# Patient Record
Sex: Female | Born: 1945 | Race: White | Hispanic: No | Smoking: Former smoker
Health system: Southern US, Community
[De-identification: ages and names within clinical notes are randomized; demographics above are authoritative.]

## PROBLEM LIST (undated history)

## (undated) DIAGNOSIS — Z860100 Personal history of colon polyps, unspecified: Secondary | ICD-10-CM

## (undated) DIAGNOSIS — R1310 Dysphagia, unspecified: Secondary | ICD-10-CM

## (undated) DIAGNOSIS — H269 Unspecified cataract: Secondary | ICD-10-CM

## (undated) DIAGNOSIS — K5790 Diverticulosis of intestine, part unspecified, without perforation or abscess without bleeding: Secondary | ICD-10-CM

## (undated) DIAGNOSIS — K648 Other hemorrhoids: Secondary | ICD-10-CM

## (undated) DIAGNOSIS — C50919 Malignant neoplasm of unspecified site of unspecified female breast: Secondary | ICD-10-CM

## (undated) DIAGNOSIS — E785 Hyperlipidemia, unspecified: Secondary | ICD-10-CM

## (undated) DIAGNOSIS — M810 Age-related osteoporosis without current pathological fracture: Secondary | ICD-10-CM

## (undated) DIAGNOSIS — K2970 Gastritis, unspecified, without bleeding: Secondary | ICD-10-CM

## (undated) DIAGNOSIS — K219 Gastro-esophageal reflux disease without esophagitis: Secondary | ICD-10-CM

## (undated) HISTORY — DX: Dysphagia, unspecified: R13.10

## (undated) HISTORY — DX: Hyperlipidemia, unspecified: E78.5

## (undated) HISTORY — PX: HEMORRHOID BANDING: SHX5850

## (undated) HISTORY — DX: Age-related osteoporosis without current pathological fracture: M81.0

## (undated) HISTORY — DX: Unspecified cataract: H26.9

## (undated) HISTORY — DX: Gastro-esophageal reflux disease without esophagitis: K21.9

## (undated) HISTORY — PX: FOOT SURGERY: SHX648

## (undated) HISTORY — DX: Other hemorrhoids: K64.8

## (undated) HISTORY — PX: OTHER SURGICAL HISTORY: SHX169

## (undated) HISTORY — DX: Malignant neoplasm of unspecified site of unspecified female breast: C50.919

## (undated) HISTORY — DX: Personal history of colon polyps, unspecified: Z86.0100

## (undated) HISTORY — DX: Personal history of colonic polyps: Z86.010

## (undated) HISTORY — DX: Gastritis, unspecified, without bleeding: K29.70

## (undated) HISTORY — DX: Diverticulosis of intestine, part unspecified, without perforation or abscess without bleeding: K57.90

## (undated) HISTORY — PX: BELPHAROPTOSIS REPAIR: SHX369

---

## 1999-08-25 ENCOUNTER — Encounter: Payer: Self-pay | Admitting: Emergency Medicine

## 1999-08-25 ENCOUNTER — Emergency Department (HOSPITAL_COMMUNITY): Admission: EM | Admit: 1999-08-25 | Discharge: 1999-08-25 | Payer: Self-pay | Admitting: *Deleted

## 2010-12-09 ENCOUNTER — Encounter: Payer: Self-pay | Admitting: Family Medicine

## 2010-12-10 ENCOUNTER — Ambulatory Visit (INDEPENDENT_AMBULATORY_CARE_PROVIDER_SITE_OTHER): Payer: Self-pay | Admitting: Family Medicine

## 2010-12-10 ENCOUNTER — Encounter: Payer: Self-pay | Admitting: Family Medicine

## 2010-12-10 VITALS — BP 124/79 | HR 73 | Temp 97.8°F | Ht 61.0 in | Wt 142.0 lb

## 2010-12-10 DIAGNOSIS — M79609 Pain in unspecified limb: Secondary | ICD-10-CM

## 2010-12-10 DIAGNOSIS — B079 Viral wart, unspecified: Secondary | ICD-10-CM

## 2010-12-10 DIAGNOSIS — M79673 Pain in unspecified foot: Secondary | ICD-10-CM

## 2010-12-10 DIAGNOSIS — M21611 Bunion of right foot: Secondary | ICD-10-CM

## 2010-12-10 DIAGNOSIS — M21619 Bunion of unspecified foot: Secondary | ICD-10-CM

## 2010-12-10 NOTE — Patient Instructions (Signed)
It is so good to see you! For your foot try the new pad and see how you feel I want you to see [facility] when you know your schedule.  You can call for an appointment.  I will see you in February for your wellness visit.

## 2010-12-10 NOTE — Assessment & Plan Note (Signed)
Patient did have liquidation placed on wart of the finger. Gave patient potential side effects and to just monitor it if seems to be worse or any signs of infection to come back for evaluation

## 2010-12-10 NOTE — Assessment & Plan Note (Signed)
Per patient's report she has had this trouble for some time and is told that she is in need of a surgery. Patient though placed with a metatarsal pad as well as a bunion pad for the foot. Patient did have some improvement in pain. In addition to this patient was told that seen in sports medicine for the potential of custom orthotics could hopefully delay the need for having surgery. Patient will call and make that appointment in the future when she looks at her scheduled. Will see patient again in February for her wellness visit and we'll see how she is doing at that time

## 2010-12-10 NOTE — Progress Notes (Signed)
  Subjective:    Patient ID: Melissa Frey, female    DOB: Jun 25, 1945, 65 y.o.   MRN: [id number]  HPI 65 year old healthy female coming here to establish care. 1. right foot pain: Patient has had this problem for quite some time patient is told that she does need to have a first ray a fusion done to help her with this pain. Patient at this point uses wide shoes as well as bunion covers to help her with this pain that she has on the medial aspect of her first toe. Patient states that he is stopped her from doing certain activities such as running and wearing cute shoes but otherwise can do most of her activities of daily living without any problems.   2. on her right middle finger patient has had this abnormal growth on the ulnar dorsal aspect of the finger. Patient has had it for some time and is now starting to get on the radial aspect as well and would like to know if there's anything she can do about it. Patient denies much pain I when she hits it on different things denies any swelling denies any fevers or chills  3. preventative patient is due for colonoscopy in one year patient also does not want to do any type of Pap smear and otherwise think she is doing very well. Patient stays active does attend above routine daily and continues walking most days a week patient also does ride her bicycle to work sometimes   Review of Systems Denies fever, chills, nausea vomiting abdominal pain, dysuria, chest pain, shortness of breath dyspnea on exertion or numbness in extremities Past medical history, social, surgical and family history all reviewed.      Objective:   Physical Exam BP 124/79  Pulse 73  Temp(Src) 97.8 F (36.6 C) (Oral)  Ht 5\' 1"  (1.549 m)  Wt 142 lb (64.411 kg)  BMI 26.83 kg/m2 General appearance: alert and cooperative Eyes: conjunctivae/corneas clear. PERRL, EOM's intact. Fundi benign. Nose: Nares normal. Septum midline. Mucosa normal. No drainage or sinus tenderness. Neck:  no adenopathy, no carotid bruit, supple, symmetrical, trachea midline and thyroid not enlarged, symmetric, no tenderness/mass/nodules Lungs: clear to auscultation bilaterally Heart: regular rate and rhythm, S1, S2 normal, no murmur, click, rub or gallop Abdomen: soft, non-tender; bowel sounds normal; no masses,  no organomegaly Extremities: Patient's right foot does have a significant tibial side bunion over the first metatarsal joint mildly tender to palpation patient's first toe is significantly deviated laterally almost crossing the f second toe with mild angulation as well. Patient has significant breakdown of the transverse arch of his right side but still good amount of longitudinal arch  Patient does have a bunion on the left foot as well in the same position that is nontender but does cause lateral deviation of the first toe on that side but no angulation  Patient's middle finger shows a likely wart and invaginates on the lateral aspect of the middle finger and goes beneath the nail bed on both sides    Assessment & Plan:

## 2011-10-16 DIAGNOSIS — M2010 Hallux valgus (acquired), unspecified foot: Secondary | ICD-10-CM | POA: Diagnosis not present

## 2012-01-07 DIAGNOSIS — Z23 Encounter for immunization: Secondary | ICD-10-CM | POA: Diagnosis not present

## 2012-01-07 DIAGNOSIS — L255 Unspecified contact dermatitis due to plants, except food: Secondary | ICD-10-CM | POA: Diagnosis not present

## 2012-02-04 HISTORY — PX: FOOT SURGERY: SHX648

## 2012-02-06 DIAGNOSIS — M2010 Hallux valgus (acquired), unspecified foot: Secondary | ICD-10-CM | POA: Diagnosis not present

## 2012-02-06 DIAGNOSIS — M25579 Pain in unspecified ankle and joints of unspecified foot: Secondary | ICD-10-CM | POA: Diagnosis not present

## 2012-02-06 DIAGNOSIS — Z01818 Encounter for other preprocedural examination: Secondary | ICD-10-CM | POA: Diagnosis not present

## 2012-02-06 DIAGNOSIS — M779 Enthesopathy, unspecified: Secondary | ICD-10-CM | POA: Diagnosis not present

## 2012-02-06 DIAGNOSIS — M24576 Contracture, unspecified foot: Secondary | ICD-10-CM | POA: Diagnosis not present

## 2012-02-06 DIAGNOSIS — M24573 Contracture, unspecified ankle: Secondary | ICD-10-CM | POA: Diagnosis not present

## 2012-02-06 DIAGNOSIS — M24873 Other specific joint derangements of unspecified ankle, not elsewhere classified: Secondary | ICD-10-CM | POA: Diagnosis not present

## 2012-02-06 DIAGNOSIS — M2040 Other hammer toe(s) (acquired), unspecified foot: Secondary | ICD-10-CM | POA: Diagnosis not present

## 2012-02-10 DIAGNOSIS — M2010 Hallux valgus (acquired), unspecified foot: Secondary | ICD-10-CM | POA: Diagnosis not present

## 2012-03-04 DIAGNOSIS — M2010 Hallux valgus (acquired), unspecified foot: Secondary | ICD-10-CM | POA: Diagnosis not present

## 2012-03-16 ENCOUNTER — Encounter (HOSPITAL_COMMUNITY): Payer: Self-pay | Admitting: Emergency Medicine

## 2012-03-16 ENCOUNTER — Telehealth: Payer: Self-pay | Admitting: Family Medicine

## 2012-03-16 ENCOUNTER — Emergency Department (INDEPENDENT_AMBULATORY_CARE_PROVIDER_SITE_OTHER)
Admission: EM | Admit: 2012-03-16 | Discharge: 2012-03-16 | Disposition: A | Discharge status: Home / Self Care | Payer: Medicare Other | Source: Home / Self Care

## 2012-03-16 DIAGNOSIS — R220 Localized swelling, mass and lump, head: Secondary | ICD-10-CM | POA: Diagnosis not present

## 2012-03-16 DIAGNOSIS — R221 Localized swelling, mass and lump, neck: Secondary | ICD-10-CM

## 2012-03-16 NOTE — ED Provider Notes (Signed)
History     CSN: [id number]  Arrival date & time 03/16/12  1551   None     Chief Complaint  Patient presents with  . Cyst    (Consider location/radiation/quality/duration/timing/severity/associated sxs/prior treatment) HPI Comments: 67 year old female presents with the complaint of a left lung on her left ear. She noticed it 2 days ago. She was concerned that tenderness associated with a lump was essentially an anginal equivalent. Chest no history of heart disease. She denies ear pain sore throat, toothache, headache or other known lumps or lymphadenopathy. She denies fever, chills, malaise or other constitutional symptoms.   History reviewed. No pertinent past medical history.  Past Surgical History  Procedure Laterality Date  . Cesarean section  1977/1983   . Foot surgery      Family History  Problem Relation Age of Onset  . Cancer Mother     breast  . Heart failure Mother     History  Substance Use Topics  . Smoking status: Never Smoker   . Smokeless tobacco: Not on file  . Alcohol Use: Yes    OB History   Grav Para Term Preterm Abortions TAB SAB Ect Mult Living                  Review of Systems  Constitutional: Negative.   HENT: Positive for facial swelling. Negative for hearing loss, ear pain, congestion, sore throat, rhinorrhea, trouble swallowing, neck stiffness, postnasal drip, sinus pressure, tinnitus and ear discharge.   Respiratory: Negative.   Cardiovascular: Negative.   Gastrointestinal: Negative.   Endocrine: Negative.   Genitourinary: Negative.   Skin: Negative.   Neurological: Negative.   All other systems reviewed and are negative.    Allergies  Review of patient's allergies indicates no known allergies.  Home Medications   Current Outpatient Rx  Name  Route  Sig  Dispense  Refill  . aspirin 81 MG tablet   Oral   Take 81 mg by mouth daily.         . Multiple Vitamin (MULTIVITAMIN) tablet   Oral   Take 1 tablet by mouth  daily.           BP 132/80  Pulse 67  Temp(Src) 98.1 F (36.7 C) (Oral)  Resp 16  SpO2 100%  Physical Exam  Nursing note and vitals reviewed. Constitutional: She is oriented to person, place, and time. She appears well-developed and well-nourished. No distress.  HENT:  Head: Normocephalic and atraumatic.  Mouth/Throat: Oropharynx is clear and moist. No oropharyngeal exudate.  Eyes: EOM are normal. Pupils are equal, round, and reactive to light.  Neck: Normal range of motion. Neck supple. No tracheal deviation present. No thyromegaly present.  Palpation of the postauricular area reveals a smaller than a BB size nodule. It was quite difficult to find and took several minutes. It is not observed. Is mildly tender, nonmobile and no surrounding lymphadenopathy. No other nodes are palpable. There is tenderness approximately the length of 4 cm inferiorly. No overlying discoloration or swelling.  Cardiovascular: Normal rate and normal heart sounds.   Pulmonary/Chest: Effort normal and breath sounds normal. No respiratory distress.  Abdominal: Soft. There is no tenderness.  Musculoskeletal: Normal range of motion.  Lymphadenopathy:    She has no cervical adenopathy.  Neurological: She is alert and oriented to person, place, and time. No cranial nerve deficit.  Skin: Skin is warm and dry.  Psychiatric: She has a normal mood and affect.    ED Course  Procedures (including critical care time)  Labs Reviewed - No data to display No results found.   1. Swelling, mass, or lump in head and neck       MDM  This is a generally healthy 67 year old female, active, energetic with no significant past medical history. The BB size lesion was quite difficult to locate apparently there is mild tenderness there. The tenderness inferior to this lesion is probably muscular. When turning her neck to the right this produces some pressure or pain in that area. There are no apparent findings to calls  lymphadenitis. The oropharynx dentition in ENT exam is normal. She is afebrile and has no other associated symptoms. Is recommended that if she continues to palpate there is small and that she should followup with her doctor in approximately one week. If she develops other symptoms such as enlargement and tenderness of lymph nodes, headache, fever or other constitutional symptoms she should seek medical care immediately. She is discharged in stable condition.         Hayden Rasmussen, NP 03/16/12 2030

## 2012-03-16 NOTE — Telephone Encounter (Signed)
Patient states there is a knot on her carotid artery. C/o of dull pain.Wants to be seen today or tomorrow. Please Advise  Ancil Boozer, SMA

## 2012-03-16 NOTE — Telephone Encounter (Signed)
Spoke with patient and she states she has a knot under ear. Area is  painful . Just does not feel right. Advised that we do not have an available appointment here today but recommended she go to Urgent Care. She voices understanding.

## 2012-03-16 NOTE — ED Notes (Signed)
Sunday noticed knot behind left ear, patient has pain behind left ear and radiates down left side of neck, no fever.

## 2012-03-16 NOTE — ED Notes (Signed)
No discharge instructions available 

## 2012-03-16 NOTE — ED Provider Notes (Signed)
Medical screening examination/treatment/procedure(s) were performed by resident physician or non-physician practitioner and as supervising physician I was immediately available for consultation/collaboration.   Laquia Rosano DOUGLAS MD.   Atoya Andrew D Takashi Korol, MD 03/16/12 2045 

## 2012-03-23 DIAGNOSIS — M2010 Hallux valgus (acquired), unspecified foot: Secondary | ICD-10-CM | POA: Diagnosis not present

## 2012-03-26 DIAGNOSIS — M26609 Unspecified temporomandibular joint disorder, unspecified side: Secondary | ICD-10-CM | POA: Diagnosis not present

## 2012-03-26 DIAGNOSIS — J019 Acute sinusitis, unspecified: Secondary | ICD-10-CM | POA: Diagnosis not present

## 2012-04-06 DIAGNOSIS — M2010 Hallux valgus (acquired), unspecified foot: Secondary | ICD-10-CM | POA: Diagnosis not present

## 2012-04-28 DIAGNOSIS — M899 Disorder of bone, unspecified: Secondary | ICD-10-CM | POA: Diagnosis not present

## 2012-04-28 DIAGNOSIS — K644 Residual hemorrhoidal skin tags: Secondary | ICD-10-CM | POA: Diagnosis not present

## 2012-04-28 DIAGNOSIS — H269 Unspecified cataract: Secondary | ICD-10-CM | POA: Diagnosis not present

## 2012-04-28 DIAGNOSIS — M949 Disorder of cartilage, unspecified: Secondary | ICD-10-CM | POA: Diagnosis not present

## 2012-04-28 DIAGNOSIS — M810 Age-related osteoporosis without current pathological fracture: Secondary | ICD-10-CM | POA: Insufficient documentation

## 2012-05-13 DIAGNOSIS — M2010 Hallux valgus (acquired), unspecified foot: Secondary | ICD-10-CM | POA: Diagnosis not present

## 2012-05-17 DIAGNOSIS — Z1231 Encounter for screening mammogram for malignant neoplasm of breast: Secondary | ICD-10-CM | POA: Diagnosis not present

## 2013-02-11 DIAGNOSIS — H5210 Myopia, unspecified eye: Secondary | ICD-10-CM | POA: Diagnosis not present

## 2013-02-11 DIAGNOSIS — H25049 Posterior subcapsular polar age-related cataract, unspecified eye: Secondary | ICD-10-CM | POA: Diagnosis not present

## 2013-02-11 DIAGNOSIS — H2510 Age-related nuclear cataract, unspecified eye: Secondary | ICD-10-CM | POA: Diagnosis not present

## 2013-02-22 DIAGNOSIS — Z6827 Body mass index (BMI) 27.0-27.9, adult: Secondary | ICD-10-CM | POA: Diagnosis not present

## 2013-02-22 DIAGNOSIS — R0789 Other chest pain: Secondary | ICD-10-CM | POA: Diagnosis not present

## 2013-03-09 DIAGNOSIS — E785 Hyperlipidemia, unspecified: Secondary | ICD-10-CM | POA: Diagnosis not present

## 2013-03-09 DIAGNOSIS — K644 Residual hemorrhoidal skin tags: Secondary | ICD-10-CM | POA: Diagnosis not present

## 2013-03-09 DIAGNOSIS — R82998 Other abnormal findings in urine: Secondary | ICD-10-CM | POA: Diagnosis not present

## 2013-03-09 DIAGNOSIS — M949 Disorder of cartilage, unspecified: Secondary | ICD-10-CM | POA: Diagnosis not present

## 2013-03-09 DIAGNOSIS — M899 Disorder of bone, unspecified: Secondary | ICD-10-CM | POA: Diagnosis not present

## 2013-03-10 DIAGNOSIS — T148 Other injury of unspecified body region: Secondary | ICD-10-CM | POA: Diagnosis not present

## 2013-03-10 DIAGNOSIS — W57XXXA Bitten or stung by nonvenomous insect and other nonvenomous arthropods, initial encounter: Secondary | ICD-10-CM | POA: Diagnosis not present

## 2013-03-23 ENCOUNTER — Encounter: Payer: Self-pay | Admitting: Gastroenterology

## 2013-03-23 DIAGNOSIS — M899 Disorder of bone, unspecified: Secondary | ICD-10-CM | POA: Diagnosis not present

## 2013-03-23 DIAGNOSIS — Z6827 Body mass index (BMI) 27.0-27.9, adult: Secondary | ICD-10-CM | POA: Diagnosis not present

## 2013-03-23 DIAGNOSIS — F17200 Nicotine dependence, unspecified, uncomplicated: Secondary | ICD-10-CM | POA: Diagnosis not present

## 2013-03-23 DIAGNOSIS — E785 Hyperlipidemia, unspecified: Secondary | ICD-10-CM | POA: Diagnosis not present

## 2013-03-23 DIAGNOSIS — Z23 Encounter for immunization: Secondary | ICD-10-CM | POA: Diagnosis not present

## 2013-03-23 DIAGNOSIS — M949 Disorder of cartilage, unspecified: Secondary | ICD-10-CM | POA: Diagnosis not present

## 2013-03-23 DIAGNOSIS — Z1331 Encounter for screening for depression: Secondary | ICD-10-CM | POA: Diagnosis not present

## 2013-03-23 DIAGNOSIS — Z0000 Encounter for general adult medical examination without abnormal findings: Secondary | ICD-10-CM | POA: Diagnosis not present

## 2013-03-23 DIAGNOSIS — K644 Residual hemorrhoidal skin tags: Secondary | ICD-10-CM | POA: Diagnosis not present

## 2013-03-24 DIAGNOSIS — Z1212 Encounter for screening for malignant neoplasm of rectum: Secondary | ICD-10-CM | POA: Diagnosis not present

## 2013-04-03 HISTORY — PX: CATARACT EXTRACTION: SUR2

## 2013-04-07 DIAGNOSIS — H2510 Age-related nuclear cataract, unspecified eye: Secondary | ICD-10-CM | POA: Diagnosis not present

## 2013-04-07 DIAGNOSIS — H2589 Other age-related cataract: Secondary | ICD-10-CM | POA: Diagnosis not present

## 2013-04-07 DIAGNOSIS — H25049 Posterior subcapsular polar age-related cataract, unspecified eye: Secondary | ICD-10-CM | POA: Diagnosis not present

## 2013-05-02 ENCOUNTER — Ambulatory Visit (AMBULATORY_SURGERY_CENTER): Payer: Self-pay | Admitting: *Deleted

## 2013-05-02 VITALS — Ht 61.0 in | Wt 144.4 lb

## 2013-05-02 DIAGNOSIS — Z1211 Encounter for screening for malignant neoplasm of colon: Secondary | ICD-10-CM

## 2013-05-02 MED ORDER — NA SULFATE-K SULFATE-MG SULF 17.5-3.13-1.6 GM/177ML PO SOLN: ORAL | Status: DC

## 2013-05-02 NOTE — Progress Notes (Signed)
Patient denies any allergies to eggs or soy. Patient denies any problems with anesthesia.  

## 2013-05-19 ENCOUNTER — Encounter: Payer: Medicare Other | Admitting: Gastroenterology

## 2013-11-02 DIAGNOSIS — L821 Other seborrheic keratosis: Secondary | ICD-10-CM | POA: Diagnosis not present

## 2013-11-02 DIAGNOSIS — D239 Other benign neoplasm of skin, unspecified: Secondary | ICD-10-CM | POA: Diagnosis not present

## 2013-11-02 DIAGNOSIS — L570 Actinic keratosis: Secondary | ICD-10-CM | POA: Diagnosis not present

## 2013-11-02 DIAGNOSIS — L719 Rosacea, unspecified: Secondary | ICD-10-CM | POA: Diagnosis not present

## 2013-11-02 DIAGNOSIS — B079 Viral wart, unspecified: Secondary | ICD-10-CM | POA: Diagnosis not present

## 2013-11-02 DIAGNOSIS — I781 Nevus, non-neoplastic: Secondary | ICD-10-CM | POA: Diagnosis not present

## 2013-11-17 DIAGNOSIS — Z6823 Body mass index (BMI) 23.0-23.9, adult: Secondary | ICD-10-CM | POA: Diagnosis not present

## 2013-11-17 DIAGNOSIS — Z23 Encounter for immunization: Secondary | ICD-10-CM | POA: Diagnosis not present

## 2013-11-17 DIAGNOSIS — K644 Residual hemorrhoidal skin tags: Secondary | ICD-10-CM | POA: Diagnosis not present

## 2014-03-29 DIAGNOSIS — H11003 Unspecified pterygium of eye, bilateral: Secondary | ICD-10-CM | POA: Diagnosis not present

## 2014-03-29 DIAGNOSIS — H5713 Ocular pain, bilateral: Secondary | ICD-10-CM | POA: Diagnosis not present

## 2014-04-18 ENCOUNTER — Other Ambulatory Visit (HOSPITAL_COMMUNITY): Payer: Self-pay | Admitting: Internal Medicine

## 2014-04-18 DIAGNOSIS — R1310 Dysphagia, unspecified: Secondary | ICD-10-CM | POA: Diagnosis not present

## 2014-04-18 DIAGNOSIS — Z6827 Body mass index (BMI) 27.0-27.9, adult: Secondary | ICD-10-CM | POA: Diagnosis not present

## 2014-04-18 DIAGNOSIS — R1314 Dysphagia, pharyngoesophageal phase: Secondary | ICD-10-CM

## 2014-04-18 DIAGNOSIS — R0989 Other specified symptoms and signs involving the circulatory and respiratory systems: Secondary | ICD-10-CM | POA: Diagnosis not present

## 2014-05-24 ENCOUNTER — Ambulatory Visit (HOSPITAL_COMMUNITY): Payer: Medicare Other

## 2014-05-24 DIAGNOSIS — Z803 Family history of malignant neoplasm of breast: Secondary | ICD-10-CM | POA: Diagnosis not present

## 2014-05-24 DIAGNOSIS — Z1231 Encounter for screening mammogram for malignant neoplasm of breast: Secondary | ICD-10-CM | POA: Diagnosis not present

## 2014-06-15 ENCOUNTER — Ambulatory Visit (HOSPITAL_COMMUNITY)
Admission: RE | Admit: 2014-06-15 | Discharge: 2014-06-15 | Disposition: A | Discharge status: Home / Self Care | Payer: Medicare Other | Source: Ambulatory Visit | Attending: Internal Medicine | Admitting: Internal Medicine

## 2014-06-15 DIAGNOSIS — R1314 Dysphagia, pharyngoesophageal phase: Secondary | ICD-10-CM

## 2014-06-15 DIAGNOSIS — E785 Hyperlipidemia, unspecified: Secondary | ICD-10-CM | POA: Diagnosis not present

## 2014-06-15 DIAGNOSIS — M859 Disorder of bone density and structure, unspecified: Secondary | ICD-10-CM | POA: Diagnosis not present

## 2014-06-15 DIAGNOSIS — Z01419 Encounter for gynecological examination (general) (routine) without abnormal findings: Secondary | ICD-10-CM | POA: Diagnosis not present

## 2014-06-15 DIAGNOSIS — R1310 Dysphagia, unspecified: Secondary | ICD-10-CM | POA: Insufficient documentation

## 2014-06-15 DIAGNOSIS — Z0000 Encounter for general adult medical examination without abnormal findings: Secondary | ICD-10-CM | POA: Diagnosis not present

## 2014-06-15 DIAGNOSIS — K219 Gastro-esophageal reflux disease without esophagitis: Secondary | ICD-10-CM | POA: Diagnosis not present

## 2014-06-15 NOTE — Procedures (Addendum)
  Objective Swallowing Evaluation: Other (Comment)  Patient Details  Name: Melissa Frey MRN: [id number] Date of Birth: 1946-01-05  Today's Date: 06/15/2014 Time: 1305-1330 30 minutes  Past Medical History:  Past Medical History  Diagnosis Date  . Cataract    Past Surgical History:  Past Surgical History  Procedure Laterality Date  . Cesarean section  1977/1983   . Foot surgery    . Cataract extraction  04/2013  . Belpharoptosis repair     HPI:  Pt referred for MBS due to report of food dysphagia over many years.  Symptoms of food lodging in throat (most notably meats) reported with pt requiring liquids to clear.  If pt is unable to clear, she will vomit at times.  She denies requiring heimlich maneuver or having unintentional weight loss.  She does state she feels as if she is choking at times and points to proximal esophagus to indicate additional area of difficulty.  Pt admits to h/o reflux issues which she attributes to drinking ETOH late at night.  She denies recent reflux symptoms.  Subjective: pt awake in chair  Assessment / Plan / Recommendation    Pt with functional oropharyngeal swallow without aspiration or penetration of any consistency tested.  Swallow was timely and strong with no residuals.  Pt observed consuming thin, nectar, pudding, cracker and barium tablet with thin.    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  Advised pt to masticate foods thoroughly and consume liquids during meal.    Barium tablet appeared to readily transit through esophagus into stomach.  Please see separate radiologist report. Thanks for this referral.    CHL IP DIET RECOMMENDATION 06/15/2014  SLP Diet Recommendations Age appropriate regular solids;Thin  Liquid Administration via (None)  Medication Administration Whole meds with liquid  Compensations Slow rate;Small sips/bites  Postural Changes and/or  Swallow Maneuvers (None)     CHL IP OTHER RECOMMENDATIONS 06/15/2014  Recommended Consults Consider esophageal assessment         CHL IP REASON FOR REFERRAL 06/15/2014  Reason for Referral Objectively evaluate swallowing function     CHL IP ORAL PHASE 06/15/2014  Oral Phase WFL      CHL IP PHARYNGEAL PHASE 06/15/2014  Pharyngeal Phase WFL      CHL IP CERVICAL ESOPHAGEAL PHASE 06/15/2014  Cervical Esophageal Phase WFL    CHL IP GO 06/15/2014  Functional Assessment Tool Used MBS, clinical judgement  Functional Limitations Swallowing  Swallow Current Status (B5102) CI  Swallow Goal Status (H8527) CI  Swallow Discharge Status (P8242) [place], [facility] S[facility] [phone]

## 2014-06-20 DIAGNOSIS — Z23 Encounter for immunization: Secondary | ICD-10-CM | POA: Diagnosis not present

## 2014-06-20 DIAGNOSIS — E785 Hyperlipidemia, unspecified: Secondary | ICD-10-CM | POA: Diagnosis not present

## 2014-06-20 DIAGNOSIS — Z1389 Encounter for screening for other disorder: Secondary | ICD-10-CM | POA: Diagnosis not present

## 2014-06-20 DIAGNOSIS — M859 Disorder of bone density and structure, unspecified: Secondary | ICD-10-CM | POA: Diagnosis not present

## 2014-06-20 DIAGNOSIS — Z780 Asymptomatic menopausal state: Secondary | ICD-10-CM | POA: Diagnosis not present

## 2014-06-20 DIAGNOSIS — R42 Dizziness and giddiness: Secondary | ICD-10-CM | POA: Diagnosis not present

## 2014-06-20 DIAGNOSIS — Z6827 Body mass index (BMI) 27.0-27.9, adult: Secondary | ICD-10-CM | POA: Diagnosis not present

## 2014-06-20 DIAGNOSIS — R1310 Dysphagia, unspecified: Secondary | ICD-10-CM | POA: Diagnosis not present

## 2014-06-20 DIAGNOSIS — Z0000 Encounter for general adult medical examination without abnormal findings: Secondary | ICD-10-CM | POA: Diagnosis not present

## 2014-06-22 ENCOUNTER — Encounter (HOSPITAL_COMMUNITY): Payer: Medicare Other

## 2014-06-23 DIAGNOSIS — Z1212 Encounter for screening for malignant neoplasm of rectum: Secondary | ICD-10-CM | POA: Diagnosis not present

## 2014-09-19 ENCOUNTER — Ambulatory Visit: Payer: Medicare Other

## 2014-09-19 ENCOUNTER — Encounter: Payer: Self-pay | Admitting: Podiatry

## 2014-09-19 ENCOUNTER — Ambulatory Visit (INDEPENDENT_AMBULATORY_CARE_PROVIDER_SITE_OTHER): Payer: Medicare Other | Admitting: Podiatry

## 2014-09-19 ENCOUNTER — Ambulatory Visit (INDEPENDENT_AMBULATORY_CARE_PROVIDER_SITE_OTHER): Payer: Medicare Other

## 2014-09-19 VITALS — BP 126/86 | HR 70 | Resp 16

## 2014-09-19 DIAGNOSIS — M2040 Other hammer toe(s) (acquired), unspecified foot: Secondary | ICD-10-CM

## 2014-09-19 DIAGNOSIS — M2012 Hallux valgus (acquired), left foot: Secondary | ICD-10-CM

## 2014-09-19 DIAGNOSIS — M2011 Hallux valgus (acquired), right foot: Secondary | ICD-10-CM | POA: Diagnosis not present

## 2014-09-19 DIAGNOSIS — M2041 Other hammer toe(s) (acquired), right foot: Secondary | ICD-10-CM

## 2014-09-19 NOTE — Progress Notes (Signed)
She presents today after having not seen her for quite some time with a chief complaint of a painful bunion to the left foot. She states that she would like to see by getting this bunion fixed in the near future. She states that she already wears a triple E shoe and she is starting to develop pain medially and laterally at the first and fifth metatarsophalangeal joints. She has considerable soreness on a relation and was shoe gear which limits her daily activities and is chronically painful. She denies changes in her past medical history medications allergies surgery social history and review of systems.  Objective: Vital signs are stable she is alert and oriented 3. Apparent then nutritional status. Pulses are strongly palpable bilateral. Capillary fill time to digits 1 through 5 was immediate. Deep tendon reflexes are intact bilateral neurologic sensorium is intact for Semmes-Weinstein monofilament. Muscle strength +5 over 5 dorsiflexion plantar flexors and inverters everters onto the musculatures intact. Orthopedic evaluation of his resolved joints distal to the ankle range of motion without crepitation. Severe hallux abductovalgus deformity with pain on palpation and range of motion of the first and second metatarsophalangeal joint. Her bunion deformity is resulting in hammertoe deformity with dislocation at the second metatarsophalangeal joint. 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 Hammertoe deformity is dislocated at the second metatarsophalangeal joint as is the first metatarsophalangeal joint. Cutaneous evaluation demonstrates supple well-hydrated cutis no erythema edema cellulitis drainage or odor other than some mild erythema overlying the prominence of the head of the first metatarsal as well as the fifth metatarsal.  Assessment: 69 year old  white female considering surgical intervention for severe bunion deformity left foot and hammertoe deformity second digit left foot.  Plan: Discussed etiology pathology conservative versus surgical therapies. At this point we discussed the necessity for surgical intervention which will consist of a Lapidus procedure second metatarsal osteotomy with screws hammertoe repair with pin and cast application. We did discuss the possible complications which may include but are not limited to postop pain bleeding swelling infection recurrence need for further surgery loss of digit also limb loss of life possible development of a deep venous thrombosis and pulmonary embolism that could result in death. She understands that she will be utilizing a cast with crutches and a knee scooter for a period of time I will follow-up with her in the near future.

## 2015-01-03 ENCOUNTER — Telehealth: Payer: Self-pay | Admitting: *Deleted

## 2015-01-03 NOTE — Telephone Encounter (Signed)
"  I want to cancel my surgery."    I attempted to call patient to see if she wanted to reschedule surgery.  I left a message for her to give me a call.  I called and canceled surgery at the surgical center.

## 2015-01-04 NOTE — Telephone Encounter (Signed)
"  I have a surgery with Dr. Milinda Pointer scheduled for 01/13.  I'm doing quite well and I think I will cancel that surgery and just do that some other time.  I will put it off for several months."

## 2015-01-17 ENCOUNTER — Encounter: Payer: Self-pay | Admitting: Gastroenterology

## 2015-02-13 ENCOUNTER — Ambulatory Visit (AMBULATORY_SURGERY_CENTER): Payer: Self-pay

## 2015-02-13 VITALS — Ht 60.75 in | Wt 148.8 lb

## 2015-02-13 DIAGNOSIS — Z1211 Encounter for screening for malignant neoplasm of colon: Secondary | ICD-10-CM

## 2015-02-13 NOTE — Progress Notes (Signed)
No allergies to eggs or soy No home oxygen No diet/weight loss meds No past problems with anesthesia  Has email and internet; registered emmi

## 2015-02-20 ENCOUNTER — Encounter: Payer: Self-pay | Admitting: *Deleted

## 2015-02-22 ENCOUNTER — Encounter: Payer: Self-pay | Admitting: Podiatry

## 2015-02-26 ENCOUNTER — Encounter: Payer: Self-pay | Admitting: Gastroenterology

## 2015-02-26 ENCOUNTER — Ambulatory Visit (AMBULATORY_SURGERY_CENTER): Payer: Medicare Other | Admitting: Gastroenterology

## 2015-02-26 VITALS — BP 160/74 | HR 56 | Temp 98.4°F | Resp 16 | Ht 60.0 in | Wt 148.0 lb

## 2015-02-26 DIAGNOSIS — D122 Benign neoplasm of ascending colon: Secondary | ICD-10-CM | POA: Diagnosis not present

## 2015-02-26 DIAGNOSIS — D125 Benign neoplasm of sigmoid colon: Secondary | ICD-10-CM | POA: Diagnosis not present

## 2015-02-26 DIAGNOSIS — Z1211 Encounter for screening for malignant neoplasm of colon: Secondary | ICD-10-CM | POA: Diagnosis not present

## 2015-02-26 DIAGNOSIS — D120 Benign neoplasm of cecum: Secondary | ICD-10-CM

## 2015-02-26 MED ORDER — SODIUM CHLORIDE 0.9 % IV SOLN: 4.0000 mg | Freq: Once | INTRAVENOUS | Status: DC

## 2015-02-26 MED ORDER — SODIUM CHLORIDE 0.9 % IV SOLN: 500.0000 mL | INTRAVENOUS | Status: DC

## 2015-02-26 NOTE — Progress Notes (Signed)
1445NAUSEA RESOLVED. PATIENT STATING SHE NEEDS TO STAY IN RECOVERY FOR A WHILE LONGER. POSITIONED ON RIGHTSIDE ENCOURAGED TO PASS FLATUS.

## 2015-02-26 NOTE — Progress Notes (Signed)
Called to room to assist during endoscopic procedure.  Patient ID and intended procedure confirmed with present staff. Received instructions for my participation in the procedure from the performing physician.  

## 2015-02-26 NOTE — Patient Instructions (Addendum)
No NSAIDS FOR TWO WEEKS, March 12, 2015.  HANDOUTS GIVEN: POLYPS, DIVERTICULOSIS, HEMORRHOIDAL BANDING.   YOU HAD AN ENDOSCOPIC PROCEDURE TODAY AT [facility]:   Refer to the procedure report that was given to you for any specific questions about what was found during the examination.  If the procedure report does not answer your questions, please call your gastroenterologist to clarify.  If you requested that your care partner not be given the details of your procedure findings, then the procedure report has been included in a sealed envelope for you to review at your convenience later.  YOU SHOULD EXPECT: Some feelings of bloating in the abdomen. Passage of more gas than usual.  Walking can help get rid of the air that was put into your GI tract during the procedure and reduce the bloating. If you had a lower endoscopy (such as a colonoscopy or flexible sigmoidoscopy) you may notice spotting of blood in your stool or on the toilet paper. If you underwent a bowel prep for your procedure, you may not have a normal bowel movement for a few days.  Please Note:  You might notice some irritation and congestion in your nose or some drainage.  This is from the oxygen used during your procedure.  There is no need for concern and it should clear up in a day or so.  SYMPTOMS TO REPORT IMMEDIATELY:   Following lower endoscopy (colonoscopy or flexible sigmoidoscopy):  Excessive amounts of blood in the stool  Significant tenderness or worsening of abdominal pains  Swelling of the abdomen that is new, acute  Fever of 100F or higher    For urgent or emergent issues, a gastroenterologist can be reached at any hour by calling [phone].   DIET: Your first meal following the procedure should be a small meal and then it is ok to progress to your normal diet. Heavy or fried foods are harder to digest and may make you feel nauseous or bloated.  Likewise, meals heavy in dairy and  vegetables can increase bloating.  Drink plenty of fluids but you should avoid alcoholic beverages for 24 hours.  ACTIVITY:  You should plan to take it easy for the rest of today and you should NOT DRIVE or use heavy machinery until tomorrow (because of the sedation medicines used during the test).    FOLLOW UP: Our staff will call the number listed on your records the next business day following your procedure to check on you and address any questions or concerns that you may have regarding the information given to you following your procedure. If we do not reach you, we will leave a message.  However, if you are feeling well and you are not experiencing any problems, there is no need to return our call.  We will assume that you have returned to your regular daily activities without incident.  If any biopsies were taken you will be contacted by phone or by letter within the next 1-3 weeks.  Please call us at [phone] if you have not heard about the biopsies in 3 weeks.    SIGNATURES/CONFIDENTIALITY: You and/or your care partner have signed paperwork which will be entered into your electronic medical record.  These signatures attest to the fact that that the information above on your After Visit Summary has been reviewed and is understood.  Full responsibility of the confidentiality of this discharge information lies with you and/or your care-partner.

## 2015-02-26 NOTE — Op Note (Signed)
[place]  [address] [place], [address]   COLONOSCOPY PROCEDURE REPORT  PATIENT: Melissa Frey, Melissa Frey  MR#: [id number] BIRTHDATE: 11/14/1945 , 69  yrs. old GENDER: female ENDOSCOPIST: Yetta Flock, MD REFERRED BY: Velna Hatchet MD PROCEDURE DATE:  02/26/2015 PROCEDURE:   Colonoscopy, screening, Colonoscopy with snare polypectomy, and Colonoscopy with biopsy First Screening Colonoscopy - Avg.  risk and is 50 yrs.  old or older Yes.  Prior Negative Screening - Now for repeat screening. N/A  History of Adenoma - Now for follow-up colonoscopy & has been > or = to 3 yrs.  N/A  Polyps removed today? Yes ASA CLASS:   Class II INDICATIONS:Screening for colonic neoplasia and Colorectal Neoplasm Risk Assessment for this procedure is average risk. MEDICATIONS: Propofol 450 mg IV  DESCRIPTION OF PROCEDURE:   After the risks benefits and alternatives of the procedure were thoroughly explained, informed consent was obtained.  The digital rectal exam revealed no abnormalities of the rectum.   The LB [id number]  endoscope was introduced through the anus and advanced to the cecum, which was identified by both the appendix and ileocecal valve. No adverse events experienced.   The quality of the prep was adequate  The instrument was then slowly withdrawn as the colon was fully examined. Estimated blood loss is zero unless otherwise noted in this procedure report.   COLON FINDINGS: There was severe diverticulosis in the left colon and mild diverticulosis in the right colon.  The colon was very angulated and the diverticulosis made for a difficult intubation. A diminutive sessile cecal polyp was noted and removed with cold forceps.  A 54mm and 31mm sessile ascending colon polyp was noted and removed via cold snare.  A semipedunculated roughly 5-60mm polyp was noted at an angulated turn in the sigmoid colon and removed via hot snare.  The remainder of the colon was  normal.  Retroflexed views revealed internal hemorrhoids. The time to cecum = 8.2 Withdrawal time = 26.1   The scope was withdrawn and the procedure completed. COMPLICATIONS: There were no immediate complications.  ENDOSCOPIC IMPRESSION: Severe diverticulosis with sharply angulated turns in the left colon. 4 colon polyps removed as outlined above Internal hemorrhoids  RECOMMENDATIONS: No NSAIDs for 2 weeks Resume diet Resume medications Await pathology results Schedule for banding procedure in the clinic if interested in hemorrhoid therapy  eSigned:  Yetta Flock, MD 02/26/2015 2:20 PM   cc:  Velna Hatchet MD, the patient   PATIENT NAME:  Melissa Frey, Melissa Frey MR#: [id number]

## 2015-02-26 NOTE — Progress Notes (Signed)
A/ox3 pleased with MAC, report to Karen RN 

## 2015-02-26 NOTE — Progress Notes (Signed)
PATIENT COMPLAINED OF NAUSEA, DR. Wilma Flavin ORDERING ZOFRAN 4 MG IV. GIVEN ZOFRAN 4 MG IV [id number] BY NATALIE SECHRIST RN

## 2015-02-27 ENCOUNTER — Telehealth: Payer: Self-pay

## 2015-02-27 NOTE — Telephone Encounter (Signed)
  Follow up Call-  Call back number 02/26/2015  Post procedure Call Back phone  # #[phone] cell  Permission to leave phone message Yes     Patient was called for follow up after her procedure on 02/26/2015. No answer at the number given. A message was left on her answering machine.

## 2015-03-01 ENCOUNTER — Encounter: Payer: Self-pay | Admitting: Podiatry

## 2015-03-05 ENCOUNTER — Encounter: Payer: Self-pay | Admitting: *Deleted

## 2015-04-02 DIAGNOSIS — D485 Neoplasm of uncertain behavior of skin: Secondary | ICD-10-CM | POA: Diagnosis not present

## 2015-04-02 DIAGNOSIS — Z23 Encounter for immunization: Secondary | ICD-10-CM | POA: Diagnosis not present

## 2015-04-02 DIAGNOSIS — L821 Other seborrheic keratosis: Secondary | ICD-10-CM | POA: Diagnosis not present

## 2015-04-02 DIAGNOSIS — C44622 Squamous cell carcinoma of skin of right upper limb, including shoulder: Secondary | ICD-10-CM | POA: Diagnosis not present

## 2015-04-02 DIAGNOSIS — Z85828 Personal history of other malignant neoplasm of skin: Secondary | ICD-10-CM | POA: Diagnosis not present

## 2015-04-02 DIAGNOSIS — D225 Melanocytic nevi of trunk: Secondary | ICD-10-CM | POA: Diagnosis not present

## 2015-04-02 DIAGNOSIS — L570 Actinic keratosis: Secondary | ICD-10-CM | POA: Diagnosis not present

## 2015-04-24 ENCOUNTER — Ambulatory Visit (INDEPENDENT_AMBULATORY_CARE_PROVIDER_SITE_OTHER): Payer: Medicare Other | Admitting: Gastroenterology

## 2015-04-24 ENCOUNTER — Encounter: Payer: Self-pay | Admitting: Gastroenterology

## 2015-04-24 VITALS — BP 100/66 | HR 72 | Ht 60.75 in | Wt 145.4 lb

## 2015-04-24 DIAGNOSIS — K648 Other hemorrhoids: Secondary | ICD-10-CM

## 2015-04-24 NOTE — Patient Instructions (Signed)

## 2015-04-24 NOTE — Progress Notes (Signed)
PROCEDURE NOTE: The patient presents with symptomatic grade II  hemorrhoids, requesting rubber band ligation of his/her hemorrhoidal disease.  All risks, benefits and alternative forms of therapy were described and informed consent was obtained.   The anorectum was pre-medicated with 0.125% nitroglycerin The decision was made to band the RA  internal hemorrhoid, and the [place] was used to perform band ligation without complication.  Digital anorectal examination was then performed to assure proper positioning of the band, and to adjust the banded tissue as required.  The patient was discharged home without pain or other issues.  Dietary and behavioral recommendations were given and along with follow-up instructions.     The following adjunctive treatments were recommended: Daily fiber supplement  The patient will return in 2-3 weeks for  follow-up and possible additional banding as required. No complications were encountered and the patient tolerated the procedure well.

## 2015-05-10 ENCOUNTER — Encounter: Payer: Self-pay | Admitting: Gastroenterology

## 2015-06-05 DIAGNOSIS — Z6828 Body mass index (BMI) 28.0-28.9, adult: Secondary | ICD-10-CM | POA: Diagnosis not present

## 2015-06-05 DIAGNOSIS — L237 Allergic contact dermatitis due to plants, except food: Secondary | ICD-10-CM | POA: Diagnosis not present

## 2015-06-06 ENCOUNTER — Encounter: Payer: Self-pay | Admitting: Gastroenterology

## 2015-07-10 ENCOUNTER — Encounter: Payer: Self-pay | Admitting: Gastroenterology

## 2015-07-10 ENCOUNTER — Ambulatory Visit (INDEPENDENT_AMBULATORY_CARE_PROVIDER_SITE_OTHER): Payer: Medicare Other | Admitting: Gastroenterology

## 2015-07-10 VITALS — BP 114/76 | HR 80 | Ht 60.0 in | Wt 146.1 lb

## 2015-07-10 DIAGNOSIS — Z1231 Encounter for screening mammogram for malignant neoplasm of breast: Secondary | ICD-10-CM | POA: Diagnosis not present

## 2015-07-10 DIAGNOSIS — R8290 Unspecified abnormal findings in urine: Secondary | ICD-10-CM | POA: Diagnosis not present

## 2015-07-10 DIAGNOSIS — K648 Other hemorrhoids: Secondary | ICD-10-CM

## 2015-07-10 DIAGNOSIS — Z803 Family history of malignant neoplasm of breast: Secondary | ICD-10-CM | POA: Diagnosis not present

## 2015-07-10 DIAGNOSIS — E784 Other hyperlipidemia: Secondary | ICD-10-CM | POA: Diagnosis not present

## 2015-07-10 NOTE — Patient Instructions (Signed)
You have been scheduled for your 3rd hemorrhoidal banding on Wednesday, 08/08/15 @ 4:00 pm.  HEMORRHOID BANDING PROCEDURE    FOLLOW-UP CARE   1. The procedure you have had should have been relatively painless since the banding of the area involved does not have nerve endings and there is no pain sensation.  The rubber band cuts off the blood supply to the hemorrhoid and the band may fall off as soon as 48 hours after the banding (the band may occasionally be seen in the toilet bowl following a bowel movement). You may notice a temporary feeling of fullness in the rectum which should respond adequately to plain Tylenol or Motrin.  2. Following the banding, avoid strenuous exercise that evening and resume full activity the next day.  A sitz bath (soaking in a warm tub) or bidet is soothing, and can be useful for cleansing the area after bowel movements.     3. To avoid constipation, take two tablespoons of natural wheat bran, natural oat bran, flax, Benefiber or any over the counter fiber supplement and increase your water intake to 7-8 glasses daily.    4. Unless you have been prescribed anorectal medication, do not put anything inside your rectum for two weeks: No suppositories, enemas, fingers, etc.  5. Occasionally, you may have more bleeding than usual after the banding procedure.  This is often from the untreated hemorrhoids rather than the treated one.  Don't be concerned if there is a tablespoon or so of blood.  If there is more blood than this, lie flat with your bottom higher than your head and apply an ice pack to the area. If the bleeding does not stop within a half an hour or if you feel faint, call our office at [phone] or go to the emergency room.  6. Problems are not common; however, if there is a substantial amount of bleeding, severe pain, chills, fever or difficulty passing urine (very rare) or other problems, you should call us at [phone] or report to the nearest  emergency room.  7. Do not stay seated continuously for more than 2-3 hours for a day or two after the procedure.  Tighten your buttock muscles 10-15 times every two hours and take 10-15 deep breaths every 1-2 hours.  Do not spend more than a few minutes on the toilet if you cannot empty your bowel; instead re-visit the toilet at a later time.

## 2015-07-10 NOTE — Progress Notes (Signed)
PROCEDURE NOTE: The patient presents with symptomatic grade II  hemorrhoids, requesting rubber band ligation of his/her hemorrhoidal disease.  All risks, benefits and alternative forms of therapy were described and informed consent was obtained.   The anorectum was pre-medicated with 0.125% nitroglycerin The decision was made to band the RP internal hemorrhoid, and the [place] was used to perform band ligation without complication.  Digital anorectal examination was then performed to assure proper positioning of the band, and to adjust the banded tissue as required.  The patient was discharged home without pain or other issues.  Dietary and behavioral recommendations were given and along with follow-up instructions.     The following adjunctive treatments were recommended: Daily fiber supplement  The patient will return in 2-4 weeks for  follow-up and possible additional banding as required. No complications were encountered and the patient tolerated the procedure well.  Ector Cellar, MD [facility] Pager [phone]

## 2015-07-17 DIAGNOSIS — Z1389 Encounter for screening for other disorder: Secondary | ICD-10-CM | POA: Diagnosis not present

## 2015-07-17 DIAGNOSIS — Z6827 Body mass index (BMI) 27.0-27.9, adult: Secondary | ICD-10-CM | POA: Diagnosis not present

## 2015-07-17 DIAGNOSIS — Z0000 Encounter for general adult medical examination without abnormal findings: Secondary | ICD-10-CM | POA: Diagnosis not present

## 2015-07-17 DIAGNOSIS — M859 Disorder of bone density and structure, unspecified: Secondary | ICD-10-CM | POA: Diagnosis not present

## 2015-07-17 DIAGNOSIS — Z86010 Personal history of colonic polyps: Secondary | ICD-10-CM | POA: Diagnosis not present

## 2015-07-17 DIAGNOSIS — Z23 Encounter for immunization: Secondary | ICD-10-CM | POA: Diagnosis not present

## 2015-07-17 DIAGNOSIS — E784 Other hyperlipidemia: Secondary | ICD-10-CM | POA: Diagnosis not present

## 2015-07-23 DIAGNOSIS — Z1212 Encounter for screening for malignant neoplasm of rectum: Secondary | ICD-10-CM | POA: Diagnosis not present

## 2015-08-01 DIAGNOSIS — C44629 Squamous cell carcinoma of skin of left upper limb, including shoulder: Secondary | ICD-10-CM | POA: Diagnosis not present

## 2015-08-01 DIAGNOSIS — C44622 Squamous cell carcinoma of skin of right upper limb, including shoulder: Secondary | ICD-10-CM | POA: Diagnosis not present

## 2015-08-08 ENCOUNTER — Encounter: Payer: Self-pay | Admitting: Gastroenterology

## 2015-09-24 DIAGNOSIS — Z85828 Personal history of other malignant neoplasm of skin: Secondary | ICD-10-CM | POA: Diagnosis not present

## 2015-09-24 DIAGNOSIS — L570 Actinic keratosis: Secondary | ICD-10-CM | POA: Diagnosis not present

## 2015-09-24 DIAGNOSIS — L720 Epidermal cyst: Secondary | ICD-10-CM | POA: Diagnosis not present

## 2015-12-10 DIAGNOSIS — Z23 Encounter for immunization: Secondary | ICD-10-CM | POA: Diagnosis not present

## 2016-04-08 DIAGNOSIS — H5711 Ocular pain, right eye: Secondary | ICD-10-CM | POA: Diagnosis not present

## 2016-04-08 DIAGNOSIS — H11003 Unspecified pterygium of eye, bilateral: Secondary | ICD-10-CM | POA: Diagnosis not present

## 2016-07-09 DIAGNOSIS — M859 Disorder of bone density and structure, unspecified: Secondary | ICD-10-CM | POA: Diagnosis not present

## 2016-07-09 DIAGNOSIS — Z0000 Encounter for general adult medical examination without abnormal findings: Secondary | ICD-10-CM | POA: Diagnosis not present

## 2016-07-09 DIAGNOSIS — E784 Other hyperlipidemia: Secondary | ICD-10-CM | POA: Diagnosis not present

## 2016-07-17 DIAGNOSIS — Z6826 Body mass index (BMI) 26.0-26.9, adult: Secondary | ICD-10-CM | POA: Diagnosis not present

## 2016-07-17 DIAGNOSIS — Z1389 Encounter for screening for other disorder: Secondary | ICD-10-CM | POA: Diagnosis not present

## 2016-07-17 DIAGNOSIS — M810 Age-related osteoporosis without current pathological fracture: Secondary | ICD-10-CM | POA: Diagnosis not present

## 2016-07-17 DIAGNOSIS — Z0000 Encounter for general adult medical examination without abnormal findings: Secondary | ICD-10-CM | POA: Diagnosis not present

## 2016-07-17 DIAGNOSIS — E784 Other hyperlipidemia: Secondary | ICD-10-CM | POA: Diagnosis not present

## 2016-08-12 DIAGNOSIS — H2512 Age-related nuclear cataract, left eye: Secondary | ICD-10-CM | POA: Diagnosis not present

## 2016-08-12 DIAGNOSIS — H11002 Unspecified pterygium of left eye: Secondary | ICD-10-CM | POA: Diagnosis not present

## 2016-08-12 DIAGNOSIS — H5213 Myopia, bilateral: Secondary | ICD-10-CM | POA: Diagnosis not present

## 2016-08-21 DIAGNOSIS — L720 Epidermal cyst: Secondary | ICD-10-CM | POA: Diagnosis not present

## 2016-08-21 DIAGNOSIS — Z85828 Personal history of other malignant neoplasm of skin: Secondary | ICD-10-CM | POA: Diagnosis not present

## 2016-08-21 DIAGNOSIS — L570 Actinic keratosis: Secondary | ICD-10-CM | POA: Diagnosis not present

## 2016-08-21 DIAGNOSIS — D1801 Hemangioma of skin and subcutaneous tissue: Secondary | ICD-10-CM | POA: Diagnosis not present

## 2016-08-21 DIAGNOSIS — L814 Other melanin hyperpigmentation: Secondary | ICD-10-CM | POA: Diagnosis not present

## 2016-08-21 DIAGNOSIS — L821 Other seborrheic keratosis: Secondary | ICD-10-CM | POA: Diagnosis not present

## 2016-08-21 DIAGNOSIS — D225 Melanocytic nevi of trunk: Secondary | ICD-10-CM | POA: Diagnosis not present

## 2016-09-30 DIAGNOSIS — Z1231 Encounter for screening mammogram for malignant neoplasm of breast: Secondary | ICD-10-CM | POA: Diagnosis not present

## 2016-09-30 DIAGNOSIS — M8589 Other specified disorders of bone density and structure, multiple sites: Secondary | ICD-10-CM | POA: Diagnosis not present

## 2016-09-30 DIAGNOSIS — Z803 Family history of malignant neoplasm of breast: Secondary | ICD-10-CM | POA: Diagnosis not present

## 2017-04-28 DIAGNOSIS — K2970 Gastritis, unspecified, without bleeding: Secondary | ICD-10-CM | POA: Diagnosis not present

## 2017-04-28 DIAGNOSIS — Z6826 Body mass index (BMI) 26.0-26.9, adult: Secondary | ICD-10-CM | POA: Diagnosis not present

## 2017-06-16 ENCOUNTER — Other Ambulatory Visit: Payer: Self-pay | Admitting: Internal Medicine

## 2017-06-16 DIAGNOSIS — R110 Nausea: Secondary | ICD-10-CM

## 2017-06-17 ENCOUNTER — Encounter: Payer: Self-pay | Admitting: Physician Assistant

## 2017-06-17 ENCOUNTER — Ambulatory Visit
Admission: RE | Admit: 2017-06-17 | Discharge: 2017-06-17 | Disposition: A | Discharge status: Home / Self Care | Payer: Self-pay | Source: Ambulatory Visit | Attending: Internal Medicine | Admitting: Internal Medicine

## 2017-06-17 DIAGNOSIS — R110 Nausea: Secondary | ICD-10-CM | POA: Diagnosis not present

## 2017-06-17 DIAGNOSIS — R109 Unspecified abdominal pain: Secondary | ICD-10-CM | POA: Diagnosis not present

## 2017-07-06 ENCOUNTER — Encounter: Payer: Self-pay | Admitting: *Deleted

## 2017-07-08 ENCOUNTER — Encounter: Payer: Self-pay | Admitting: Physician Assistant

## 2017-07-08 ENCOUNTER — Ambulatory Visit (INDEPENDENT_AMBULATORY_CARE_PROVIDER_SITE_OTHER): Payer: PPO | Admitting: Physician Assistant

## 2017-07-08 VITALS — BP 118/78 | HR 70 | Ht 60.5 in | Wt 145.2 lb

## 2017-07-08 DIAGNOSIS — K5730 Diverticulosis of large intestine without perforation or abscess without bleeding: Secondary | ICD-10-CM | POA: Diagnosis not present

## 2017-07-08 DIAGNOSIS — Z860101 Personal history of adenomatous and serrated colon polyps: Secondary | ICD-10-CM

## 2017-07-08 DIAGNOSIS — R110 Nausea: Secondary | ICD-10-CM | POA: Diagnosis not present

## 2017-07-08 DIAGNOSIS — R1013 Epigastric pain: Secondary | ICD-10-CM

## 2017-07-08 DIAGNOSIS — Z86010 Personal history of colonic polyps: Secondary | ICD-10-CM | POA: Insufficient documentation

## 2017-07-08 NOTE — Patient Instructions (Signed)
If you are age 71 or older, your body mass index should be between 23-30. Your Body mass index is 27.9 kg/m. If this is out of the aforementioned range listed, please consider follow up with your Primary Care Provider.  Continue Protonix 40 mg by mouth every morning with breakfast. Continue to limit Alcohol No aspirin or NSAIDS.  ( Advil, Ibuprofen, Aleve)    You have been scheduled for an endoscopy. Please follow written instructions given to you at your visit today. If you use inhalers (even only as needed), please bring them with you on the day of your procedure. Your physician has requested that you go to www.startemmi.com and enter the access code given to you at your visit today. This web site gives a general overview about your procedure. However, you should still follow specific instructions given to you by our office regarding your preparation for the procedure.

## 2017-07-08 NOTE — Progress Notes (Signed)
Agree with assessment and plan as outlined.  
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
## 2017-07-14 DIAGNOSIS — E7849 Other hyperlipidemia: Secondary | ICD-10-CM | POA: Diagnosis not present

## 2017-07-14 DIAGNOSIS — M810 Age-related osteoporosis without current pathological fracture: Secondary | ICD-10-CM | POA: Diagnosis not present

## 2017-07-14 DIAGNOSIS — R82998 Other abnormal findings in urine: Secondary | ICD-10-CM | POA: Diagnosis not present

## 2017-07-23 DIAGNOSIS — Z1212 Encounter for screening for malignant neoplasm of rectum: Secondary | ICD-10-CM | POA: Diagnosis not present

## 2017-07-24 DIAGNOSIS — Z6826 Body mass index (BMI) 26.0-26.9, adult: Secondary | ICD-10-CM | POA: Diagnosis not present

## 2017-07-24 DIAGNOSIS — M810 Age-related osteoporosis without current pathological fracture: Secondary | ICD-10-CM | POA: Diagnosis not present

## 2017-07-24 DIAGNOSIS — K2970 Gastritis, unspecified, without bleeding: Secondary | ICD-10-CM | POA: Diagnosis not present

## 2017-07-24 DIAGNOSIS — Z23 Encounter for immunization: Secondary | ICD-10-CM | POA: Diagnosis not present

## 2017-07-24 DIAGNOSIS — M79672 Pain in left foot: Secondary | ICD-10-CM | POA: Diagnosis not present

## 2017-07-24 DIAGNOSIS — E7849 Other hyperlipidemia: Secondary | ICD-10-CM | POA: Diagnosis not present

## 2017-07-24 DIAGNOSIS — Z1389 Encounter for screening for other disorder: Secondary | ICD-10-CM | POA: Diagnosis not present

## 2017-07-24 DIAGNOSIS — Z0000 Encounter for general adult medical examination without abnormal findings: Secondary | ICD-10-CM | POA: Diagnosis not present

## 2017-07-24 DIAGNOSIS — M79671 Pain in right foot: Secondary | ICD-10-CM | POA: Diagnosis not present

## 2017-08-12 ENCOUNTER — Encounter: Payer: Self-pay | Admitting: Gastroenterology

## 2017-08-25 ENCOUNTER — Ambulatory Visit (AMBULATORY_SURGERY_CENTER): Payer: PPO | Admitting: Gastroenterology

## 2017-08-25 ENCOUNTER — Encounter: Payer: Self-pay | Admitting: Gastroenterology

## 2017-08-25 VITALS — BP 125/65 | HR 68 | Temp 98.4°F | Resp 17 | Ht 60.0 in | Wt 145.0 lb

## 2017-08-25 DIAGNOSIS — K3189 Other diseases of stomach and duodenum: Secondary | ICD-10-CM | POA: Diagnosis not present

## 2017-08-25 DIAGNOSIS — R110 Nausea: Secondary | ICD-10-CM

## 2017-08-25 MED ORDER — SODIUM CHLORIDE 0.9 % IV SOLN: 500.0000 mL | Freq: Once | INTRAVENOUS | Status: DC

## 2017-08-25 NOTE — Progress Notes (Signed)
Spontaneous respirations throughout. VSS. Resting comfortably. To PACU on room air. Report to  RN. 

## 2017-08-25 NOTE — Progress Notes (Signed)
Called to room to assist during endoscopic procedure.  Patient ID and intended procedure confirmed with present staff. Received instructions for my participation in the procedure from the performing physician.  

## 2017-08-25 NOTE — Patient Instructions (Signed)
Handouts:  Hiatal Hernia  YOU HAD AN ENDOSCOPIC PROCEDURE TODAY AT THE [facility]:   Refer to the procedure report that was given to you for any specific questions about what was found during the examination.  If the procedure report does not answer your questions, please call your gastroenterologist to clarify.  If you requested that your care partner not be given the details of your procedure findings, then the procedure report has been included in a sealed envelope for you to review at your convenience later.  YOU SHOULD EXPECT: Some feelings of bloating in the abdomen. Passage of more gas than usual.  Walking can help get rid of the air that was put into your GI tract during the procedure and reduce the bloating. If you had a lower endoscopy (such as a colonoscopy or flexible sigmoidoscopy) you may notice spotting of blood in your stool or on the toilet paper. If you underwent a bowel prep for your procedure, you may not have a normal bowel movement for a few days.  Please Note:  You might notice some irritation and congestion in your nose or some drainage.  This is from the oxygen used during your procedure.  There is no need for concern and it should clear up in a day or so.  SYMPTOMS TO REPORT IMMEDIATELY:    Following upper endoscopy (EGD)  Vomiting of blood or coffee ground material  New chest pain or pain under the shoulder blades  Painful or persistently difficult swallowing  New shortness of breath  Fever of 100F or higher  Black, tarry-looking stools  For urgent or emergent issues, a gastroenterologist can be reached at any hour by calling [phone].   DIET:  We do recommend a small meal at first, but then you may proceed to your regular diet.  Drink plenty of fluids but you should avoid alcoholic beverages for 24 hours.  ACTIVITY:  You should plan to take it easy for the rest of today and you should NOT DRIVE or use heavy machinery until tomorrow (because of  the sedation medicines used during the test).    FOLLOW UP: Our staff will call the number listed on your records the next business day following your procedure to check on you and address any questions or concerns that you may have regarding the information given to you following your procedure. If we do not reach you, we will leave a message.  However, if you are feeling well and you are not experiencing any problems, there is no need to return our call.  We will assume that you have returned to your regular daily activities without incident.  If any biopsies were taken you will be contacted by phone or by letter within the next 1-3 weeks.  Please call us at [phone] if you have not heard about the biopsies in 3 weeks.    SIGNATURES/CONFIDENTIALITY: You and/or your care partner have signed paperwork which will be entered into your electronic medical record.  These signatures attest to the fact that that the information above on your After Visit Summary has been reviewed and is understood.  Full responsibility of the confidentiality of this discharge information lies with you and/or your care-partner.

## 2017-08-25 NOTE — Op Note (Signed)
[facility] Patient Name: Melissa Frey Procedure Date: 08/25/2017 10:22 AM MRN: [id number] Endoscopist: Remo Lipps P. Havery Moros , MD Age: 72 Referring MD:  Date of Birth: December 29, 1945 Gender: Female Account #: [id number] Procedure:                Upper GI endoscopy Indications:              chronic nausea, improved with avoidance of coffee                            and avoidance of other trigger foods / liquids, on                            protonix 40mg  Medicines:                Monitored Anesthesia Care Procedure:                Pre-Anesthesia Assessment:                           - Prior to the procedure, a History and Physical                            was performed, and patient medications and                            allergies were reviewed. The patient's tolerance of                            previous anesthesia was also reviewed. The risks                            and benefits of the procedure and the sedation                            options and risks were discussed with the patient.                            All questions were answered, and informed consent                            was obtained. Prior Anticoagulants: The patient has                            taken no previous anticoagulant or antiplatelet                            agents. ASA Grade Assessment: II - A patient with                            mild systemic disease. After reviewing the risks                            and benefits, the patient was deemed in  satisfactory condition to undergo the procedure.                           After obtaining informed consent, the endoscope was                            passed under direct vision. Throughout the                            procedure, the patient's blood pressure, pulse, and                            oxygen saturations were monitored continuously. The                            Endoscope was introduced  through the mouth, and                            advanced to the second part of duodenum. The upper                            GI endoscopy was accomplished without difficulty.                            The patient tolerated the procedure well. Scope In: Scope Out: Findings:                 Esophagogastric landmarks were identified: the                            Z-line was found at 35 cm, the gastroesophageal                            junction was found at 35 cm and the upper extent of                            the gastric folds was found at 38 cm from the                            incisors.                           A 3 cm hiatal hernia was present.                           The exam of the esophagus was otherwise normal.                           The entire examined stomach was normal.                           Biopsies were taken with a cold forceps in the  gastric body, at the incisura and in the gastric                            antrum for Helicobacter pylori testing.                           The duodenal bulb and second portion of the                            duodenum were normal. Complications:            No immediate complications. Estimated blood loss:                            Minimal. Estimated Blood Loss:     Estimated blood loss was minimal. Impression:               - Esophagogastric landmarks identified.                           - 3 cm hiatal hernia.                           - Normal esophagus otherwise.                           - Normal stomach. Biopsied to rule out H pylori.                           - Normal duodenal bulb and second portion of the                            duodenum. Recommendation:           - Patient has a contact number available for                            emergencies. The signs and symptoms of potential                            delayed complications were discussed with the                             patient. Return to normal activities tomorrow.                            Written discharge instructions were provided to the                            patient.                           - Resume previous diet.                           - Continue present medications.                           -  Await pathology results. If feeling improved,                            slowly add back certain trigger liquids or foods to                            see if better tolerated after a course of protonix Rease Swinson P. Zarif Rathje, MD 08/25/2017 10:39:44 AM This report has been signed electronically.

## 2017-08-26 ENCOUNTER — Telehealth: Payer: Self-pay

## 2017-08-26 NOTE — Telephone Encounter (Signed)
  Follow up Call-  Call back number 08/25/2017 02/26/2015  Post procedure Call Back phone  # [phone] cell  Permission to leave phone message Yes Yes  Some recent data might be hidden     Patient questions:  Do you have a fever, pain , or abdominal swelling? No. Pain Score  0 *  Have you tolerated food without any problems? Yes.    Have you been able to return to your normal activities? Yes.    Do you have any questions about your discharge instructions: Diet   No. Medications  No. Follow up visit  No.  Do you have questions or concerns about your Care? No.  Actions: * If pain score is 4 or above: No action needed, pain <4.

## 2017-09-02 DIAGNOSIS — L237 Allergic contact dermatitis due to plants, except food: Secondary | ICD-10-CM | POA: Diagnosis not present

## 2017-09-02 DIAGNOSIS — Z6826 Body mass index (BMI) 26.0-26.9, adult: Secondary | ICD-10-CM | POA: Diagnosis not present

## 2017-10-13 ENCOUNTER — Ambulatory Visit
Admission: RE | Admit: 2017-10-13 | Discharge: 2017-10-13 | Disposition: A | Discharge status: Home / Self Care | Payer: PPO | Source: Ambulatory Visit | Attending: Family Medicine | Admitting: Family Medicine

## 2017-10-13 ENCOUNTER — Other Ambulatory Visit: Payer: Self-pay | Admitting: Family Medicine

## 2017-10-13 DIAGNOSIS — R0902 Hypoxemia: Secondary | ICD-10-CM

## 2017-10-13 DIAGNOSIS — R071 Chest pain on breathing: Secondary | ICD-10-CM | POA: Diagnosis not present

## 2017-10-13 DIAGNOSIS — R0602 Shortness of breath: Secondary | ICD-10-CM | POA: Diagnosis not present

## 2017-10-13 DIAGNOSIS — Z6825 Body mass index (BMI) 25.0-25.9, adult: Secondary | ICD-10-CM | POA: Diagnosis not present

## 2017-10-13 DIAGNOSIS — R5383 Other fatigue: Secondary | ICD-10-CM | POA: Diagnosis not present

## 2017-10-13 DIAGNOSIS — R0901 Asphyxia: Secondary | ICD-10-CM | POA: Diagnosis not present

## 2017-10-13 DIAGNOSIS — F39 Unspecified mood [affective] disorder: Secondary | ICD-10-CM | POA: Insufficient documentation

## 2017-10-13 MED ORDER — IOPAMIDOL (ISOVUE-370) INJECTION 76%
75.0000 mL | Freq: Once | INTRAVENOUS | Status: AC | PRN
  Administered 2017-10-13: 75 mL via INTRAVENOUS

## 2017-11-03 DIAGNOSIS — Z6825 Body mass index (BMI) 25.0-25.9, adult: Secondary | ICD-10-CM | POA: Diagnosis not present

## 2017-11-03 DIAGNOSIS — I493 Ventricular premature depolarization: Secondary | ICD-10-CM | POA: Diagnosis not present

## 2017-11-03 DIAGNOSIS — R0600 Dyspnea, unspecified: Secondary | ICD-10-CM | POA: Diagnosis not present

## 2017-11-03 DIAGNOSIS — R071 Chest pain on breathing: Secondary | ICD-10-CM | POA: Diagnosis not present

## 2017-11-03 DIAGNOSIS — R0601 Orthopnea: Secondary | ICD-10-CM | POA: Diagnosis not present

## 2017-11-23 DIAGNOSIS — I491 Atrial premature depolarization: Secondary | ICD-10-CM | POA: Diagnosis not present

## 2017-11-23 DIAGNOSIS — R0602 Shortness of breath: Secondary | ICD-10-CM | POA: Diagnosis not present

## 2017-11-23 DIAGNOSIS — R0789 Other chest pain: Secondary | ICD-10-CM | POA: Diagnosis not present

## 2017-12-09 DIAGNOSIS — R0602 Shortness of breath: Secondary | ICD-10-CM | POA: Diagnosis not present

## 2017-12-09 DIAGNOSIS — R0789 Other chest pain: Secondary | ICD-10-CM | POA: Diagnosis not present

## 2017-12-25 DIAGNOSIS — R0602 Shortness of breath: Secondary | ICD-10-CM | POA: Diagnosis not present

## 2017-12-25 DIAGNOSIS — R0789 Other chest pain: Secondary | ICD-10-CM | POA: Diagnosis not present

## 2018-01-05 DIAGNOSIS — I491 Atrial premature depolarization: Secondary | ICD-10-CM | POA: Diagnosis not present

## 2018-01-05 DIAGNOSIS — E7800 Pure hypercholesterolemia, unspecified: Secondary | ICD-10-CM | POA: Diagnosis not present

## 2018-01-05 DIAGNOSIS — I351 Nonrheumatic aortic (valve) insufficiency: Secondary | ICD-10-CM | POA: Diagnosis not present

## 2018-01-05 DIAGNOSIS — R0789 Other chest pain: Secondary | ICD-10-CM | POA: Diagnosis not present

## 2018-01-12 DIAGNOSIS — D485 Neoplasm of uncertain behavior of skin: Secondary | ICD-10-CM | POA: Diagnosis not present

## 2018-01-12 DIAGNOSIS — Z23 Encounter for immunization: Secondary | ICD-10-CM | POA: Diagnosis not present

## 2018-01-12 DIAGNOSIS — L814 Other melanin hyperpigmentation: Secondary | ICD-10-CM | POA: Diagnosis not present

## 2018-01-12 DIAGNOSIS — Z85828 Personal history of other malignant neoplasm of skin: Secondary | ICD-10-CM | POA: Diagnosis not present

## 2018-01-12 DIAGNOSIS — L821 Other seborrheic keratosis: Secondary | ICD-10-CM | POA: Diagnosis not present

## 2018-01-12 DIAGNOSIS — L219 Seborrheic dermatitis, unspecified: Secondary | ICD-10-CM | POA: Diagnosis not present

## 2018-01-12 DIAGNOSIS — L570 Actinic keratosis: Secondary | ICD-10-CM | POA: Diagnosis not present

## 2018-01-12 DIAGNOSIS — L918 Other hypertrophic disorders of the skin: Secondary | ICD-10-CM | POA: Diagnosis not present

## 2018-01-12 DIAGNOSIS — D225 Melanocytic nevi of trunk: Secondary | ICD-10-CM | POA: Diagnosis not present

## 2018-03-11 DIAGNOSIS — D485 Neoplasm of uncertain behavior of skin: Secondary | ICD-10-CM | POA: Diagnosis not present

## 2018-03-11 DIAGNOSIS — L570 Actinic keratosis: Secondary | ICD-10-CM | POA: Diagnosis not present

## 2018-04-02 ENCOUNTER — Encounter: Payer: Self-pay | Admitting: Gastroenterology

## 2018-07-20 ENCOUNTER — Ambulatory Visit: Payer: Self-pay | Admitting: Cardiology

## 2018-09-09 DIAGNOSIS — N898 Other specified noninflammatory disorders of vagina: Secondary | ICD-10-CM | POA: Diagnosis not present

## 2018-09-20 ENCOUNTER — Encounter: Payer: Self-pay | Admitting: Cardiology

## 2018-09-20 ENCOUNTER — Other Ambulatory Visit: Payer: Self-pay | Admitting: Cardiology

## 2018-09-20 DIAGNOSIS — I351 Nonrheumatic aortic (valve) insufficiency: Secondary | ICD-10-CM | POA: Insufficient documentation

## 2018-09-20 DIAGNOSIS — E7800 Pure hypercholesterolemia, unspecified: Secondary | ICD-10-CM | POA: Insufficient documentation

## 2018-10-19 ENCOUNTER — Ambulatory Visit: Payer: Self-pay | Admitting: Cardiology

## 2018-10-19 DIAGNOSIS — Z0000 Encounter for general adult medical examination without abnormal findings: Secondary | ICD-10-CM | POA: Insufficient documentation

## 2018-10-19 DIAGNOSIS — M810 Age-related osteoporosis without current pathological fracture: Secondary | ICD-10-CM | POA: Diagnosis not present

## 2018-10-19 DIAGNOSIS — E7849 Other hyperlipidemia: Secondary | ICD-10-CM | POA: Diagnosis not present

## 2018-10-22 DIAGNOSIS — R82998 Other abnormal findings in urine: Secondary | ICD-10-CM | POA: Diagnosis not present

## 2018-10-22 DIAGNOSIS — L900 Lichen sclerosus et atrophicus: Secondary | ICD-10-CM | POA: Diagnosis not present

## 2018-10-22 DIAGNOSIS — N760 Acute vaginitis: Secondary | ICD-10-CM | POA: Diagnosis not present

## 2018-10-27 DIAGNOSIS — M810 Age-related osteoporosis without current pathological fracture: Secondary | ICD-10-CM | POA: Diagnosis not present

## 2018-10-27 DIAGNOSIS — R1310 Dysphagia, unspecified: Secondary | ICD-10-CM | POA: Diagnosis not present

## 2018-10-27 DIAGNOSIS — H269 Unspecified cataract: Secondary | ICD-10-CM | POA: Diagnosis not present

## 2018-10-27 DIAGNOSIS — L900 Lichen sclerosus et atrophicus: Secondary | ICD-10-CM | POA: Diagnosis not present

## 2018-10-27 DIAGNOSIS — Z0000 Encounter for general adult medical examination without abnormal findings: Secondary | ICD-10-CM | POA: Diagnosis not present

## 2018-10-27 DIAGNOSIS — Z86010 Personal history of colonic polyps: Secondary | ICD-10-CM | POA: Diagnosis not present

## 2018-10-27 DIAGNOSIS — Z1331 Encounter for screening for depression: Secondary | ICD-10-CM | POA: Diagnosis not present

## 2018-10-27 DIAGNOSIS — E785 Hyperlipidemia, unspecified: Secondary | ICD-10-CM | POA: Diagnosis not present

## 2018-10-27 DIAGNOSIS — L940 Localized scleroderma [morphea]: Secondary | ICD-10-CM | POA: Insufficient documentation

## 2018-12-07 ENCOUNTER — Encounter: Payer: Self-pay | Admitting: Gastroenterology

## 2018-12-07 ENCOUNTER — Ambulatory Visit: Payer: PPO | Admitting: Gastroenterology

## 2018-12-07 VITALS — BP 121/70 | HR 73 | Ht 63.0 in | Wt 127.0 lb

## 2018-12-07 DIAGNOSIS — Z1159 Encounter for screening for other viral diseases: Secondary | ICD-10-CM

## 2018-12-07 DIAGNOSIS — Z860100 Personal history of colon polyps, unspecified: Secondary | ICD-10-CM

## 2018-12-07 DIAGNOSIS — K649 Unspecified hemorrhoids: Secondary | ICD-10-CM | POA: Diagnosis not present

## 2018-12-07 DIAGNOSIS — R1310 Dysphagia, unspecified: Secondary | ICD-10-CM

## 2018-12-07 MED ORDER — SUPREP BOWEL PREP KIT 17.5-3.13-1.6 GM/177ML PO SOLN: ORAL | 0 refills | Status: DC

## 2018-12-07 NOTE — Patient Instructions (Addendum)
If you are age 72 or older, your body mass index should be between 23-30. Your Body mass index is 22.5 kg/m. If this is out of the aforementioned range listed, please consider follow up with your Primary Care Provider.  If you are age 79 or younger, your body mass index should be between 19-25. Your Body mass index is 22.5 kg/m. If this is out of the aformentioned range listed, please consider follow up with your Primary Care Provider.   To help prevent the possible spread of infection to our patients, communities, and staff; we will be implementing the following measures:  As of now we are not allowing any visitors/family members to accompany you to any upcoming appointments with [facility]. If you have any concerns about this please contact our office to discuss prior to the appointment.   You have been scheduled for an endoscopy and colonoscopy. Please follow the written instructions given to you at your visit today. Please pick up your prep supplies at the pharmacy within the next 1-3 days. If you use inhalers (even only as needed), please bring them with you on the day of your procedure. Your physician has requested that you go to www.startemmi.com and enter the access code given to you at your visit today. This web site gives a general overview about your procedure. However, you should still follow specific instructions given to you by our office regarding your preparation for the procedure.  Thank you for entrusting me with your care and for choosing [facility], Dr. Covina Cellar

## 2018-12-07 NOTE — Progress Notes (Signed)
HPI :  73 year old female here for a follow-up visit for dysphagia.  She states she has had dysphagia ongoing for several years, she thinks at least 12 years.  She will feel a sense of difficulty swallowing usually solids in her proximal throat, states she feels like she is choking at sometimes and may have a hard time breathing.  She typically needs to drink water and relax to get the symptoms to go away.  She denies any coughing or nasal regurgitation with this happens but she states it feels like she cannot get enough air in. She is had this occur to a variety of foods such as lettuce, apples.  She has has episodes where she is needed to vomit the food out.  This will often occur in restaurants when she is eating more quickly and perhaps not chewing as well when she is trying to talked with others. She had an EGD with me in July 2019 showing a 3 cm hiatal hernia and no obvious stenosis.  The exam at that time was done for symptoms of nausea, empiric dilation was not performed, was not aware of her dysphagia at the time.  She had a normal barium study in 2016 as well as a normal modified barium swallow as well at that time.  She did have prominent osteophytes in the C-spine and questioned extrinsic compression causing symptoms.  She has never had a prior dilation.  Otherwise she had a colonoscopy with me in 2017 at which time she had severe diverticulosis in the left colon and she had 4 polyps removed, 1 adenomatous and the others sessile serrated.  She also had internal hemorrhoids noted.  She had internal hemorrhoid banding done in 2017 a both RA and RP hemorrhoid.  She states this for the most part resolved her symptoms but she has not had any recurrence, doing very well in this regard  Prior endoscopic evaluation: Colonoscopy 02/26/2015 - There was severe diverticulosis in the left colon and mild diverticulosis in the right colon. The colon was very angulated and the diverticulosis made for a  difficult intubation. A diminutive sessile cecal polyp was noted and removed with cold forceps. A 35m and 472msessile ascending colon polyp was noted and removed via cold snare. A semipedunculated roughly 5-43m37molyp was noted at an angulated turn in the sigmoid colon and removed via hot snare. The remainder of the colon was normal. Retroflexed views revealed internal hemorrhoids. Path c/w one adenoma, rest sessile serrated  EGD 08/25/2017 -  - A 3 cm hiatal hernia was present. - The exam of the esophagus was otherwise normal. - The entire examined stomach was normal. - Biopsies were taken with a cold forceps in the gastric body, at the incisura and in the gastric antrum for Helicobacter pylori testing. - The duodenal bulb and second portion of the duodenum were normal.    Past Medical History:  Diagnosis Date  . Cataract    right cateract removed  . GERD (gastroesophageal reflux disease)   . Hyperlipidemia   . Internal hemorrhoids   . Osteoporosis      Past Surgical History:  Procedure Laterality Date  . BELPHAROPTOSIS REPAIR    . CATARACT EXTRACTION  04/2013  . CESAREAN SECTION  1977/1983   x3  . FOOT SURGERY    . HEMORRHOID BANDING     Family History  Problem Relation Age of Onset  . Cancer Mother        breast  . Heart failure  Mother   . Breast cancer Mother   . Colon cancer Neg Hx   . Esophageal cancer Neg Hx   . Rectal cancer Neg Hx   . Stomach cancer Neg Hx    Social History   Tobacco Use  . Smoking status: Former Research scientist (life sciences)  . Smokeless tobacco: Never Used  Substance Use Topics  . Alcohol use: Yes    Alcohol/week: 7.0 standard drinks    Types: 7 Glasses of wine per week    Comment: 5-7 glasses red wine per week per patient.  . Drug use: No   Current Outpatient Medications  Medication Sig Dispense Refill  . Multiple Vitamin (MULTIVITAMIN) tablet Take 1 tablet by mouth daily.    . Red Yeast Rice Extract (RED YEAST RICE PO) Take by mouth. 2 daily    .  rosuvastatin (CRESTOR) 10 MG tablet Take 10 mg by mouth daily. 1/2 tablet daily     No current facility-administered medications for this visit.    No Known Allergies   Review of Systems: All systems reviewed and negative except where noted in HPI.   Labs from [facility] in September 2020 showed normal CBC, normal c-Met, normal LFTs.  Thyroid normal  Physical Exam: BP 121/70   Pulse 73   Ht '5\' 3"'  (1.6 m)   Wt 127 lb (57.6 kg)   BMI 22.50 kg/m  Constitutional: Pleasant,well-developed, female in no acute distress. HEENT: Normocephalic and atraumatic. Conjunctivae are normal. No scleral icterus. Neck supple.  Cardiovascular: Normal rate, regular rhythm.  Pulmonary/chest: Effort normal and breath sounds normal. No wheezing, rales or rhonchi. Abdominal: Soft, nondistended, nontender.  There are no masses palpable. No hepatomegaly. Extremities: no edema Lymphadenopathy: No cervical adenopathy noted. Neurological: Alert and oriented to person place and time. Skin: Skin is warm and dry. No rashes noted. Psychiatric: Normal mood and affect. Behavior is normal.   ASSESSMENT: 73 year old female here for reassessment of the following issues:  Dysphagia - she had a prior normal EGD, normal barium study, normal modified barium study.  Symptoms are longstanding and persistent as described above.  Her symptoms sound more so oropharyngeal, unclear if she has a mild UES stenosis or extrinsic compression from cervical osteophytes causing this.  We discussed options.  She has never had a prior dilation and explained this is the one thing we could do to try to minimize her symptoms and prevent recurrence of these rather dramatic episodes she has from time to time.  We could otherwise consider repeating a modified barium swallow in hopes of trying to reproduce her symptoms.  After discussion of options she wanted to proceed with an EGD with empiric dilation following discussion of risks  and benefits.  Further recommendations pending results and her course following dilation.  She agreed  History of colon polyps - due for surveillance colonoscopy for history of polyps.  We discussed risk and benefits of colonoscopy and anesthesia and she want to proceed.  To be done at the same time as her upper endoscopy.  She agreed  Hemorrhoids -these do not bother her any further after having hemorrhoid banding.  She can follow-up as needed for this issue.  Milford Cellar, MD [facility]

## 2018-12-14 DIAGNOSIS — L900 Lichen sclerosus et atrophicus: Secondary | ICD-10-CM | POA: Diagnosis not present

## 2018-12-14 DIAGNOSIS — N952 Postmenopausal atrophic vaginitis: Secondary | ICD-10-CM | POA: Diagnosis not present

## 2018-12-26 ENCOUNTER — Other Ambulatory Visit: Payer: Self-pay | Admitting: Cardiology

## 2019-01-06 ENCOUNTER — Encounter: Payer: Self-pay | Admitting: Gastroenterology

## 2019-01-18 ENCOUNTER — Encounter: Payer: PPO | Admitting: Gastroenterology

## 2019-02-13 ENCOUNTER — Other Ambulatory Visit: Payer: Self-pay | Admitting: Cardiology

## 2019-02-22 ENCOUNTER — Encounter: Payer: PPO | Admitting: Gastroenterology

## 2019-04-25 DIAGNOSIS — R591 Generalized enlarged lymph nodes: Secondary | ICD-10-CM | POA: Diagnosis not present

## 2019-05-31 DIAGNOSIS — L570 Actinic keratosis: Secondary | ICD-10-CM | POA: Diagnosis not present

## 2019-06-14 ENCOUNTER — Other Ambulatory Visit: Payer: Self-pay | Admitting: Cardiology

## 2019-06-22 ENCOUNTER — Other Ambulatory Visit: Payer: Self-pay | Admitting: Cardiology

## 2019-07-18 DIAGNOSIS — Z7689 Persons encountering health services in other specified circumstances: Secondary | ICD-10-CM | POA: Diagnosis not present

## 2019-07-19 DIAGNOSIS — L900 Lichen sclerosus et atrophicus: Secondary | ICD-10-CM | POA: Diagnosis not present

## 2019-07-19 DIAGNOSIS — N952 Postmenopausal atrophic vaginitis: Secondary | ICD-10-CM | POA: Diagnosis not present

## 2019-08-09 DIAGNOSIS — H2512 Age-related nuclear cataract, left eye: Secondary | ICD-10-CM | POA: Diagnosis not present

## 2019-08-09 DIAGNOSIS — H11002 Unspecified pterygium of left eye: Secondary | ICD-10-CM | POA: Diagnosis not present

## 2019-08-09 DIAGNOSIS — H5213 Myopia, bilateral: Secondary | ICD-10-CM | POA: Diagnosis not present

## 2019-09-05 ENCOUNTER — Other Ambulatory Visit: Payer: Self-pay | Admitting: Cardiology

## 2019-09-06 DIAGNOSIS — Z03818 Encounter for observation for suspected exposure to other biological agents ruled out: Secondary | ICD-10-CM | POA: Diagnosis not present

## 2019-09-06 DIAGNOSIS — Z20822 Contact with and (suspected) exposure to covid-19: Secondary | ICD-10-CM | POA: Diagnosis not present

## 2019-10-27 DIAGNOSIS — M810 Age-related osteoporosis without current pathological fracture: Secondary | ICD-10-CM | POA: Diagnosis not present

## 2019-10-27 DIAGNOSIS — E785 Hyperlipidemia, unspecified: Secondary | ICD-10-CM | POA: Diagnosis not present

## 2019-11-11 DIAGNOSIS — Z0000 Encounter for general adult medical examination without abnormal findings: Secondary | ICD-10-CM | POA: Diagnosis not present

## 2019-11-11 DIAGNOSIS — L900 Lichen sclerosus et atrophicus: Secondary | ICD-10-CM | POA: Diagnosis not present

## 2019-11-11 DIAGNOSIS — R82998 Other abnormal findings in urine: Secondary | ICD-10-CM | POA: Diagnosis not present

## 2019-11-11 DIAGNOSIS — Z23 Encounter for immunization: Secondary | ICD-10-CM | POA: Diagnosis not present

## 2019-11-11 DIAGNOSIS — M810 Age-related osteoporosis without current pathological fracture: Secondary | ICD-10-CM | POA: Diagnosis not present

## 2019-11-11 DIAGNOSIS — E785 Hyperlipidemia, unspecified: Secondary | ICD-10-CM | POA: Diagnosis not present

## 2019-11-22 DIAGNOSIS — M85852 Other specified disorders of bone density and structure, left thigh: Secondary | ICD-10-CM | POA: Diagnosis not present

## 2019-11-22 DIAGNOSIS — Z1231 Encounter for screening mammogram for malignant neoplasm of breast: Secondary | ICD-10-CM | POA: Diagnosis not present

## 2019-11-22 DIAGNOSIS — M810 Age-related osteoporosis without current pathological fracture: Secondary | ICD-10-CM | POA: Diagnosis not present

## 2019-12-13 DIAGNOSIS — Z411 Encounter for cosmetic surgery: Secondary | ICD-10-CM | POA: Diagnosis not present

## 2019-12-13 DIAGNOSIS — L601 Onycholysis: Secondary | ICD-10-CM | POA: Diagnosis not present

## 2019-12-13 DIAGNOSIS — L570 Actinic keratosis: Secondary | ICD-10-CM | POA: Diagnosis not present

## 2019-12-26 DIAGNOSIS — L509 Urticaria, unspecified: Secondary | ICD-10-CM | POA: Diagnosis not present

## 2020-01-03 DIAGNOSIS — M2550 Pain in unspecified joint: Secondary | ICD-10-CM | POA: Diagnosis not present

## 2020-01-10 DIAGNOSIS — L570 Actinic keratosis: Secondary | ICD-10-CM | POA: Diagnosis not present

## 2020-01-10 DIAGNOSIS — M1990 Unspecified osteoarthritis, unspecified site: Secondary | ICD-10-CM | POA: Diagnosis not present

## 2020-01-20 DIAGNOSIS — R269 Unspecified abnormalities of gait and mobility: Secondary | ICD-10-CM | POA: Diagnosis not present

## 2020-01-20 DIAGNOSIS — R29898 Other symptoms and signs involving the musculoskeletal system: Secondary | ICD-10-CM | POA: Diagnosis not present

## 2020-01-24 ENCOUNTER — Ambulatory Visit: Payer: PPO | Admitting: Neurology

## 2020-01-24 ENCOUNTER — Encounter: Payer: Self-pay | Admitting: Neurology

## 2020-01-24 ENCOUNTER — Encounter: Payer: Self-pay | Admitting: *Deleted

## 2020-01-24 ENCOUNTER — Telehealth: Payer: Self-pay

## 2020-01-24 VITALS — BP 150/60 | HR 92 | Ht 61.0 in | Wt 121.0 lb

## 2020-01-24 DIAGNOSIS — M2550 Pain in unspecified joint: Secondary | ICD-10-CM | POA: Diagnosis not present

## 2020-01-24 DIAGNOSIS — E538 Deficiency of other specified B group vitamins: Secondary | ICD-10-CM | POA: Diagnosis not present

## 2020-01-24 DIAGNOSIS — R269 Unspecified abnormalities of gait and mobility: Secondary | ICD-10-CM | POA: Diagnosis not present

## 2020-01-24 NOTE — Telephone Encounter (Signed)
Noted, I will add the name to the, send him a report of this visit.

## 2020-01-24 NOTE — Progress Notes (Signed)
Reason for visit: Polyarthralgias, gait disorder  Referring physician: Dr. Blair Promise Melissa Frey is a 74 y.o. female  History of present illness:  Melissa Frey is a 74 year old right-handed white female who comes in with onset of generalized swelling and skin rash that occurred shortly after a Covid booster with maternal that occurred on 19 December 2019.  She had swelling of the arms and legs and rash throughout the body that persisted for 2 to 3 weeks and then gradually dissipated.  She developed polyarthralgias, particularly in the wrists and knees, but she also would have joint pain that migrated around the body including the elbows and ankles.  The patient began noticing that she was dropping things.  She would wake up at night with pain in the joints throughout the body.  The patient has noted that when she walks that she might veer to the right a bit, she feels that her balance has been altered slightly.  She has not sustained any falls.  She reports one episode where she had some numbness in the hands that went away, otherwise she has not had any numbness.  She reports no back pain or neck pain or difficulty controlling the bowels or the bladder.  She denies any significant cognitive issues although she might have some mild difficulty remembering names for people.  The patient is sent to this office for further evaluation.  She does have an appointment to see rheumatology physician in the near future.  Blood work today shows a negative ANA and rheumatoid factor, sedimentation rate was 18.  She has undergone acupuncture which has not alleviated the swelling in her joints.  The joint pain seems to be improving over time.  The patient indicates that at times she might have some flexion of the right arm when she walks, occasionally on the left.  Past Medical History:  Diagnosis Date  . Cataract    right cateract removed  . Gastritis   . GERD (gastroesophageal reflux disease)   .  Hyperlipidemia   . Internal hemorrhoids   . Osteoporosis     Past Surgical History:  Procedure Laterality Date  . BELPHAROPTOSIS REPAIR    . CATARACT EXTRACTION  04/2013  . CESAREAN SECTION  1977/1983   x3  . FOOT SURGERY Right 02/2012  . HEMORRHOID BANDING    . skin cancer removal      Family History  Problem Relation Age of Onset  . Cancer Mother        breast  . Heart failure Mother   . Breast cancer Mother   . Colon cancer Neg Hx   . Esophageal cancer Neg Hx   . Rectal cancer Neg Hx   . Stomach cancer Neg Hx     Social history:  reports that she has quit smoking. She has never used smokeless tobacco. She reports current alcohol use of about 7.0 standard drinks of alcohol per week. She reports that she does not use drugs.  Medications:  Prior to Admission medications   Medication Sig Start Date End Date Taking? Authorizing Provider  bimatoprost (LATISSE) 0.03 % ophthalmic solution APPLY 1 DROP TO UPPER EYELID EVERY EVENING. DO NOT APPLY TO LOWER EYELID 01/20/20  Yes [provider]  clobetasol ointment (TEMOVATE) 0.05 % clobetasol 0.05 % topical ointment  APPLY A THIN LAYER TO THE AFFECTED AREA BY TOPICAL ROUTE 2 TIMES DAILY   Yes [provider]  conjugated estrogens (PREMARIN) vaginal cream Premarin 0.625 mg/gram vaginal cream  Yes [provider]  Multiple Vitamin (MULTIVITAMIN) tablet Take 1 tablet by mouth daily.   Yes [provider]  Red Yeast Rice Extract (RED YEAST RICE PO) Take by mouth. 2 daily   Yes [provider]  rosuvastatin (CRESTOR) 10 MG tablet Take 1 tablet by mouth once daily 06/22/19  Yes Adrian Prows, MD  SUPREP BOWEL PREP KIT 17.5-3.13-1.6 GM/177ML SOLN Suprep-Use as directed 12/07/18  Yes Armbruster, Carlota Raspberry, MD     No Known Allergies  ROS:  Out of a complete 14 system review of symptoms, the patient complains only of the following symptoms, and all other reviewed systems are negative.  Joint  pain Walking difficulty  Blood pressure (!) 150/60, pulse 92, height '5\' 1"'  (1.549 m), weight 121 lb (54.9 kg).  Physical Exam  General: The patient is alert and cooperative at the time of the examination.  Eyes: Pupils are equal, round, and reactive to light. Discs are flat bilaterally.  Neck: The neck is supple, no carotid bruits are noted.  Respiratory: The respiratory examination is clear.  Cardiovascular: The cardiovascular examination reveals a regular rate and rhythm, no obvious murmurs or rubs are noted.  Skin: Extremities are without significant edema.  Neurologic Exam  Mental status: The patient is alert and oriented x 3 at the time of the examination. The patient has apparent normal recent and remote memory, with an apparently normal attention span and concentration ability.  Cranial nerves: Facial symmetry is present. There is good sensation of the face to pinprick and soft touch bilaterally. The strength of the facial muscles and the muscles to head turning and shoulder shrug are normal bilaterally. Speech is well enunciated, no aphasia or dysarthria is noted. Extraocular movements are full. Visual fields are full. The tongue is midline, and the patient has symmetric elevation of the soft palate. No obvious hearing deficits are noted.  Motor: The motor testing reveals 5 over 5 strength of all 4 extremities. Good symmetric motor tone is noted throughout.  Sensory: Sensory testing is intact to pinprick, soft touch, vibration sensation, and position sense on all 4 extremities. No evidence of extinction is noted.  Coordination: Cerebellar testing reveals good finger-nose-finger and heel-to-shin bilaterally.  Gait and station: Gait is normal. Tandem gait is slightly unsteady. Romberg is unsteady, the patient has a tendency to drift to the right. No drift is seen.  Reflexes: Deep tendon reflexes are symmetric and normal bilaterally, with exception that the ankle jerk reflexes  are depressed bilaterally. Toes are downgoing bilaterally.   Assessment/Plan:  1.  Polyarthralgias  2.  Mild gait disturbance  Overall, the clinical examination is relatively unremarkable with exception of some mild balance issues.  The patient reports what sounds like an allergic response to the Covid booster with a skin rash and diffuse swelling and polyarthralgias.  This issue appears to be improving.  We will check blood work today and check back in about 2 months.  I do not see a definite neurologic issue that is highly concerning currently.  If the gait problem continues to worsen however we may pursue further evaluation.  Jill Alexanders MD 01/24/2020 2:57 PM  [facility] [address] [place], [place] [address]  Phone [phone] Fax [phone]

## 2020-01-24 NOTE — Telephone Encounter (Signed)
Patient called and wanted Dr Jannifer Franklin to know the name of her rheumatologist, Dr Amil Amen at [facility]

## 2020-01-25 DIAGNOSIS — H0011 Chalazion right upper eyelid: Secondary | ICD-10-CM | POA: Diagnosis not present

## 2020-01-27 LAB — PAN-ANCA
ANCA Proteinase 3: <3.5 U/mL (ref 0.0–3.5)
Atypical pANCA: <1:20 {titer}
C-ANCA: <1:20 {titer}
Myeloperoxidase Ab: <9 U/mL (ref 0.0–9.0)
P-ANCA: <1:20 {titer}

## 2020-01-27 LAB — COMPREHENSIVE METABOLIC PANEL
ALT: 12 IU/L (ref 0–32)
AST: 20 IU/L (ref 0–40)
Albumin/Globulin Ratio: 2 (ref 1.2–2.2)
Albumin: 4.5 g/dL (ref 3.7–4.7)
Alkaline Phosphatase: 59 IU/L (ref 44–121)
BUN/Creatinine Ratio: 21 (ref 12–28)
BUN: 19 mg/dL (ref 8–27)
Bilirubin Total: 0.3 mg/dL (ref 0.0–1.2)
CO2: 24 mmol/L (ref 20–29)
Calcium: 9.1 mg/dL (ref 8.7–10.3)
Chloride: 104 mmol/L (ref 96–106)
Creatinine, Ser: 0.89 mg/dL (ref 0.57–1.00)
GFR calc Af Amer: 74 mL/min/{1.73_m2} (ref >59)
GFR calc non Af Amer: 65 mL/min/{1.73_m2} (ref >59)
Globulin, Total: 2.3 g/dL (ref 1.5–4.5)
Glucose: 113 mg/dL — ABNORMAL HIGH (ref 65–99)
Potassium: 4.2 mmol/L (ref 3.5–5.2)
Sodium: 141 mmol/L (ref 134–144)
Total Protein: 6.8 g/dL (ref 6.0–8.5)

## 2020-01-27 LAB — COPPER, SERUM: Copper: 101 ug/dL (ref 80–158)

## 2020-01-27 LAB — CK: Total CK: 43 U/L (ref 32–182)

## 2020-01-27 LAB — B. BURGDORFI ANTIBODIES: Lyme IgG/IgM Ab: <0.91 {ISR} (ref 0.00–0.90)

## 2020-01-27 LAB — CYCLIC CITRUL PEPTIDE ANTIBODY, IGG/IGA: Cyclic Citrullin Peptide Ab: 19 units (ref 0–19)

## 2020-01-27 LAB — TSH: TSH: 1.17 u[IU]/mL (ref 0.450–4.500)

## 2020-01-27 LAB — VITAMIN B12: Vitamin B-12: 1144 pg/mL (ref 232–1245)

## 2020-01-27 LAB — SEDIMENTATION RATE: Sed Rate: 2 mm/hr (ref 0–40)

## 2020-02-07 DIAGNOSIS — M2550 Pain in unspecified joint: Secondary | ICD-10-CM | POA: Diagnosis not present

## 2020-02-07 DIAGNOSIS — Z6822 Body mass index (BMI) 22.0-22.9, adult: Secondary | ICD-10-CM | POA: Diagnosis not present

## 2020-02-21 ENCOUNTER — Encounter: Payer: Self-pay | Admitting: Gastroenterology

## 2020-02-21 ENCOUNTER — Ambulatory Visit: Payer: PPO | Admitting: Gastroenterology

## 2020-02-21 VITALS — BP 130/68 | HR 79 | Ht 60.75 in | Wt 124.0 lb

## 2020-02-21 DIAGNOSIS — Z791 Long term (current) use of non-steroidal anti-inflammatories (NSAID): Secondary | ICD-10-CM | POA: Diagnosis not present

## 2020-02-21 DIAGNOSIS — Z86010 Personal history of colonic polyps: Secondary | ICD-10-CM

## 2020-02-21 DIAGNOSIS — R1032 Left lower quadrant pain: Secondary | ICD-10-CM

## 2020-02-21 DIAGNOSIS — R1310 Dysphagia, unspecified: Secondary | ICD-10-CM

## 2020-02-21 MED ORDER — PLENVU 140 G PO SOLR: 1.0000 | Freq: Once | ORAL | 0 refills | Status: AC

## 2020-02-21 NOTE — Progress Notes (Signed)
HPI :  75 year old female here for a follow-up visit regarding dysphagia, abdominal pain, history of colon polyps.  Please see last clinic note from 12/2018 for full details of her history for this.  She has had ongoing dysphagia intermittently for several years.  Can feel this with both solids and liquids in her proximal throat as well as discomfort in her mid chest at times when this happens.  She has been doing fairly well with this over the past year in regards to severity of symptoms, it appears mild and intermittent at this point.  She eats slowly and chews her food well and symptoms do not bother her as much, although it did happen yesterday.  She thinks it happens most when she eats salads such as lettuce.  She has had this happen more often in restaurants and her friends have commented to her about this in the past.  She has had an EGD with me in 2019 showing a 3 cm hiatal hernia and no obvious stenosis.  She had a normal barium study in 2016 as well as a normal modified barium swallow in 2016 as well.  She did have prominent osteophytes in the C-spine with questionable extrinsic compression.  Pills can occasionally get hung up as well.  We had discussed doing an EGD with dilation at the last visit however she was not able to follow through with the exam, it was canceled for some reason.  She otherwise had her last colonoscopy in 2017, severe diverticulosis in the left colon with 4 polyps removed, one adenomatous and the rest sessile serrated.  Generally her bowels have been doing well over the past year.  She had 1 drop of blood noted on the toilet paper relatively recently but otherwise does not see any blood in her stool.  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  She has been taking Naprosyn fairly routinely to help with the symptoms.  She has seen neurology and rheumatology for this.  She underwent a massage 2-1/2 weeks ago  where she had some manipulation done to her abdomen and states she has had some discomfort in her abdomen at that time, she felt severe pain at the time of the massage.  At this point time she has some discomfort with moving her bowels in that area focally, otherwise feels okay.  No nausea or vomiting.  No fevers.  She has some focal mild tenderness at the site as well.  The main thing that has been bothering her as above has been her arthralgias/joint pains, alternating Naprosyn and Tylenol as needed for this right now.  She inquires about long-term risks of NSAIDs and long term plan.    Prior endoscopic evaluation: Colonoscopy 02/26/2015 - There was severe diverticulosis in the left colon and mild diverticulosis in the right colon. The colon was very angulated and the diverticulosis made for a difficult intubation. A diminutive sessile cecal polyp was noted and removed with cold forceps. A 28mm and 68mm sessile ascending colon polyp was noted and removed via cold snare. A semipedunculated roughly 5-65mm polyp was noted at an angulated turn in the sigmoid colon and removed via hot snare. The remainder of the colon was normal. Retroflexed views revealed internal hemorrhoids. Path c/w one adenoma, rest sessile serrated  EGD 08/25/2017 -  - A 3 cm hiatal hernia was present. - The exam of the esophagus was otherwise normal. - The entire examined stomach was  normal. - Biopsies were taken with a cold forceps in the gastric body, at the incisura and in the gastric antrum for Helicobacter pylori testing. - The duodenal bulb and second portion of the duodenum were normal.   Past Medical History:  Diagnosis Date  . Cataract    right cateract removed  . Diverticulosis   . Dysphagia   . Gastritis   . GERD (gastroesophageal reflux disease)   . History of colon polyps   . Hyperlipidemia   . Internal hemorrhoids   . Osteoporosis      Past Surgical History:  Procedure Laterality Date  .  BELPHAROPTOSIS REPAIR    . CATARACT EXTRACTION  04/2013  . CESAREAN SECTION  1977/1983   x3  . FOOT SURGERY Right 02/2012  . HEMORRHOID BANDING    . skin cancer removal     face   Family History  Problem Relation Age of Onset  . Heart failure Mother   . Breast cancer Mother   . Colon cancer Neg Hx   . Esophageal cancer Neg Hx   . Rectal cancer Neg Hx   . Stomach cancer Neg Hx    Social History   Tobacco Use  . Smoking status: Former Research scientist (life sciences)  . Smokeless tobacco: Never Used  Vaping Use  . Vaping Use: Never used  Substance Use Topics  . Alcohol use: Yes    Alcohol/week: 7.0 standard drinks    Types: 7 Glasses of wine per week    Comment: 5-7 glasses red wine per week per patient.  . Drug use: No   Current Outpatient Medications  Medication Sig Dispense Refill  . bimatoprost (LATISSE) 0.03 % ophthalmic solution APPLY 1 DROP TO UPPER EYELID EVERY EVENING. DO NOT APPLY TO LOWER EYELID    . Biotin 10 MG CAPS Take 1 capsule by mouth daily.    . Calcium Carbonate-Vit D-Min (CALCIUM 1200 PO) Take by mouth daily.    Marland Kitchen conjugated estrogens (PREMARIN) vaginal cream as needed.    . Multiple Vitamin (MULTIVITAMIN) tablet Take 1 tablet by mouth daily.    . Omega-3 Fatty Acids (OMEGA-3 PLUS PO) Take 1 capsule by mouth daily.    . Red Yeast Rice Extract (RED YEAST RICE PO) Take by mouth. 2 daily    . rosuvastatin (CRESTOR) 10 MG tablet Take 1 tablet by mouth once daily 90 tablet 0   No current facility-administered medications for this visit.   No Known Allergies   Review of Systems: All systems reviewed and negative except where noted in HPI.   No results found for: WBC, HGB, HCT, MCV, PLT  Lab Results  Component Value Date   CREATININE 0.89 01/24/2020   BUN 19 01/24/2020   NA 141 01/24/2020   K 4.2 01/24/2020   CL 104 01/24/2020   CO2 24 01/24/2020    Lab Results  Component Value Date   ALT 12 01/24/2020   AST 20 01/24/2020   ALKPHOS 59 01/24/2020   BILITOT 0.3  01/24/2020     Physical Exam: BP 130/68   Pulse 79   Ht 5' 0.75" (1.543 m)   Wt 124 lb (56.2 kg)   SpO2 97%   BMI 23.62 kg/m  Constitutional: Pleasant,well-developed, female in no acute distress. HEENT: Normocephalic and atraumatic. Conjunctivae are normal. No scleral icterus. Neck supple.  Cardiovascular: Normal rate, regular rhythm.  Pulmonary/chest: Effort normal and breath sounds normal. Abdominal: Soft, nondistended, focal LLQ mild TTP.  There are no masses palpable.  Extremities: no edema  Lymphadenopathy: No cervical adenopathy noted. Neurological: Alert and oriented to person place and time. Skin: Skin is warm and dry. No rashes noted. Psychiatric: Normal mood and affect. Behavior is normal.   ASSESSMENT AND PLAN: 75 year old female here for reassessment of the following:  Dysphagia - as above she has had intermittent dysphagia managed with eating slowly and smaller bites.  She had a prior normal EGD, barium study, and modified barium study as work-up for this in the past. Unclear if she has a mild UES stenosis or extrinsic compression from cervical osteophytes causing this.  I had offered her an empiric dilation with a EGD at the last visit and she had wanted to consider this although was not able to follow-up for the exam.  I discussed the situation with her again and I think repeat EGD with empiric dilation is reasonable to see if this comprised any benefit for her.  I discussed EGD and anesthesia with her as well as risk and benefits and she wanted to proceed, to be done at the same time as her colonoscopy.  Further recommendations pending the results.  She agreed  Left lower quadrant pain - I suspect this is very likely musculoskeletal following massage, said she had a "psoas release".  Discomfort mostly at this time with bowel movements although does have some focal mild tenderness.  Colonoscopy planned as below will make sure things okay, but reassured her that this is  more than likely musculoskeletal and will likely improve with more time further out from the inciting event.   History of colon polyps - 4 precancerous polyps removed in 2017.  Due for surveillance colonoscopy.  We discussed risk and benefits and will proceed with this to be done at the same time as her upper endoscopy.  She agreed with the plan, further recommendations pending results.  NSAID use - we discussed long-term risks of chronic/daily NSAID use to include PUD, increased risk for kidney disease, increased risk for heart attack and stroke etc.  Long-term she will try to minimize its use, using more recently now due to her reactive arthritis, she will try to reduce to once daily and then taper down as needed.  She will continue to see her rheumatologist   La Vernia Cellar, MD [facility]

## 2020-02-21 NOTE — Patient Instructions (Addendum)
If you are age 74 or older, your body mass index should be between 23-30. Your Body mass index is 23.62 kg/m. If this is out of the aforementioned range listed, please consider follow up with your Primary Care Provider.  If you are age 53 or younger, your body mass index should be between 19-25. Your Body mass index is 23.62 kg/m. If this is out of the aformentioned range listed, please consider follow up with your Primary Care Provider.   You have been scheduled for a EGD/colonoscopy. Please follow written instructions given to you at your visit today.  Please pick up your prep supplies at the pharmacy within the next 1-3 days. If you use inhalers (even only as needed), please bring them with you on the day of your procedure.  Please activate the [employer] coupon card we are giving you today and present to the pharmacist when picking up your Prep.    Thank you for entrusting me with your care and for choosing [facility], Dr. Linden Cellar

## 2020-02-26 ENCOUNTER — Emergency Department (HOSPITAL_COMMUNITY)
Admission: EM | Admit: 2020-02-26 | Discharge: 2020-02-26 | Disposition: A | Discharge status: Home / Self Care | Payer: PPO | Attending: Emergency Medicine | Admitting: Emergency Medicine

## 2020-02-26 ENCOUNTER — Telehealth: Payer: Self-pay | Admitting: Student

## 2020-02-26 ENCOUNTER — Emergency Department (HOSPITAL_COMMUNITY): Payer: PPO

## 2020-02-26 ENCOUNTER — Other Ambulatory Visit: Payer: Self-pay

## 2020-02-26 DIAGNOSIS — Z87891 Personal history of nicotine dependence: Secondary | ICD-10-CM | POA: Diagnosis not present

## 2020-02-26 DIAGNOSIS — R6884 Jaw pain: Secondary | ICD-10-CM | POA: Insufficient documentation

## 2020-02-26 DIAGNOSIS — R072 Precordial pain: Secondary | ICD-10-CM

## 2020-02-26 DIAGNOSIS — R9431 Abnormal electrocardiogram [ECG] [EKG]: Secondary | ICD-10-CM | POA: Diagnosis not present

## 2020-02-26 DIAGNOSIS — R079 Chest pain, unspecified: Secondary | ICD-10-CM | POA: Diagnosis not present

## 2020-02-26 LAB — BASIC METABOLIC PANEL
Anion gap: 9 (ref 5–15)
BUN: 10 mg/dL (ref 8–23)
CO2: 28 mmol/L (ref 22–32)
Calcium: 9.6 mg/dL (ref 8.9–10.3)
Chloride: 104 mmol/L (ref 98–111)
Creatinine, Ser: 0.88 mg/dL (ref 0.44–1.00)
GFR, Estimated: >60 mL/min (ref >60)
Glucose, Bld: 97 mg/dL (ref 70–99)
Potassium: 3.9 mmol/L (ref 3.5–5.1)
Sodium: 141 mmol/L (ref 135–145)

## 2020-02-26 LAB — CBC
HCT: 40.6 % (ref 36.0–46.0)
Hemoglobin: 12.9 g/dL (ref 12.0–15.0)
MCH: 29.7 pg (ref 26.0–34.0)
MCHC: 31.8 g/dL (ref 30.0–36.0)
MCV: 93.5 fL (ref 80.0–100.0)
Platelets: 198 10*3/uL (ref 150–400)
RBC: 4.34 MIL/uL (ref 3.87–5.11)
RDW: 12.6 % (ref 11.5–15.5)
WBC: 4.6 10*3/uL (ref 4.0–10.5)
nRBC: 0 % (ref 0.0–0.2)

## 2020-02-26 LAB — TROPONIN I (HIGH SENSITIVITY)
Troponin I (High Sensitivity): 2 ng/L (ref <18)
Troponin I (High Sensitivity): 3 ng/L (ref <18)

## 2020-02-26 NOTE — ED Triage Notes (Signed)
C/O jaw pain since last night; concern for MI. Denies CP;

## 2020-02-26 NOTE — Telephone Encounter (Signed)
ON-CALL CARDIOLOGY 02/26/20  Patient's name: Melissa Frey.   MRN: [id number].    DOB: 07/04/1945 Primary care provider: Velna Hatchet, MD. Primary cardiologist: None, last seen in 2018  Interaction regarding this patient's care today: Patient called to notify our office that she had been having jaw and chest pain intermittently since last night and therefore she was on her way to [facility] emergency department to be evaluated.   Patient reports she was last seen by our practice in 2018 by Dr. Woody Seller for hyperlipidemia management. Unfortunately these records are not available to me for review.   Impression:   ICD-10-CM   1. Precordial pain  R07.2   2. Jaw pain  R68.84     No orders of the defined types were placed in this encounter.   No orders of the defined types were placed in this encounter.   Recommendations: Patient is in route to [facility] emergency department for evaluation, agree with patient's decision to go to ED. Advised patient that ED providers with notify our office if they require cardiology consult.   Telephone encounter total time: 13 minutes     Alethia Berthold, PA-C 02/26/2020, 11:24 AM Office: [phone]

## 2020-02-26 NOTE — ED Provider Notes (Signed)
[place] EMERGENCY DEPARTMENT Provider Note  CSN: [id number] Arrival date & time: 02/26/20 1128    History Chief Complaint  Patient presents with  . Jaw Pain    HPI  Melissa Frey is a 75 y.o. female with history of HLD but no HTN or DM reports two episodes of aching R jaw pain since last night. No associated CP, SOB, nausea or diaphoresis. She has had polyarthralgias since her Covid Booster shot a few months ago. She has no known history of CAD. She was concerned about a cardiac etiology of her jaw pain and so came to the ED for evaluation.    Past Medical History:  Diagnosis Date  . Cataract    right cateract removed  . Diverticulosis   . Dysphagia   . Gastritis   . GERD (gastroesophageal reflux disease)   . History of colon polyps   . Hyperlipidemia   . Internal hemorrhoids   . Osteoporosis     Past Surgical History:  Procedure Laterality Date  . BELPHAROPTOSIS REPAIR    . CATARACT EXTRACTION  04/2013  . CESAREAN SECTION  1977/1983   x3  . FOOT SURGERY Right 02/2012  . HEMORRHOID BANDING    . skin cancer removal     face    Family History  Problem Relation Age of Onset  . Heart failure Mother   . Breast cancer Mother   . Colon cancer Neg Hx   . Esophageal cancer Neg Hx   . Rectal cancer Neg Hx   . Stomach cancer Neg Hx     Social History   Tobacco Use  . Smoking status: Former Research scientist (life sciences)  . Smokeless tobacco: Never Used  Vaping Use  . Vaping Use: Never used  Substance Use Topics  . Alcohol use: Yes    Alcohol/week: 7.0 standard drinks    Types: 7 Glasses of wine per week    Comment: 5-7 glasses red wine per week per patient.  . Drug use: No     Home Medications Prior to Admission medications   Medication Sig Start Date End Date Taking? Authorizing Provider  acetaminophen (TYLENOL) 650 MG CR tablet Take 1,300 mg by mouth at bedtime.   Yes [provider]  BIOTIN PO Take 1 tablet by mouth daily.   Yes [provider]   Calcium Carbonate-Vitamin D (CALCIUM-D PO) Take 1 tablet by mouth daily.   Yes [provider]  Coenzyme Q10 (COQ10 PO) Take 1 capsule by mouth daily.   Yes [provider]  conjugated estrogens (PREMARIN) vaginal cream Place 1 Applicatorful vaginally as needed (dryness).   Yes [provider]  Lidocaine HCl-Benzyl Alcohol (SALONPAS LIDOCAINE PLUS EX) Apply 1 application topically at bedtime as needed (pain).   Yes [provider]  Multiple Vitamin (MULTIVITAMIN WITH MINERALS) TABS tablet Take 1 tablet by mouth daily.   Yes [provider]  naproxen sodium (ALEVE) 220 MG tablet Take 440 mg by mouth daily.   Yes [provider]  OVER THE COUNTER MEDICATION Take 1 tablet by mouth daily. [place] for arthritis pain   Yes [provider]  Red Yeast Rice Extract (RED YEAST RICE PO) Take 2 capsules by mouth daily. 2 daily   Yes [provider]  rosuvastatin (CRESTOR) 10 MG tablet Take 1 tablet by mouth once daily Patient taking differently: Take 10 mg by mouth at bedtime. 06/22/19  Yes Adrian Prows, MD  TURMERIC PO Take 2 capsules by mouth daily.  Yes [provider]     Allergies    Covid-19 mrna vacc (moderna)   Review of Systems   Review of Systems A comprehensive review of systems was completed and negative except as noted in HPI.    Physical Exam BP 125/69   Pulse 74   Temp 98.7 F (37.1 C) (Oral)   Resp 17   SpO2 97%   Physical Exam Vitals and nursing note reviewed.  Constitutional:      Appearance: Normal appearance.  HENT:     Head: Normocephalic and atraumatic.     Nose: Nose normal.     Mouth/Throat:     Mouth: Mucous membranes are moist.     Comments: No tenderness to jaw or TMJ, no tenderness to teeth, FROM of mandible Eyes:     Extraocular Movements: Extraocular movements intact.     Conjunctiva/sclera: Conjunctivae normal.  Cardiovascular:     Rate and Rhythm: Normal rate.  Pulmonary:      Effort: Pulmonary effort is normal.     Breath sounds: Normal breath sounds.  Abdominal:     General: Abdomen is flat.     Palpations: Abdomen is soft.     Tenderness: There is no abdominal tenderness.  Musculoskeletal:        General: No swelling. Normal range of motion.     Cervical back: Neck supple.  Skin:    General: Skin is warm and dry.  Neurological:     General: No focal deficit present.     Mental Status: She is alert.  Psychiatric:        Mood and Affect: Mood normal.      ED Results / Procedures / Treatments   Labs (all labs ordered are listed, but only abnormal results are displayed) Labs Reviewed  BASIC METABOLIC PANEL  CBC  TROPONIN I (HIGH SENSITIVITY)  TROPONIN I (HIGH SENSITIVITY)    EKG EKG Interpretation  Date/Time:  Sunday February 26 2020 12:18:54 EST Ventricular Rate:  71 PR Interval:  142 QRS Duration: 74 QT Interval:  366 QTC Calculation: 397 R Axis:   101 Text Interpretation: Sinus rhythm with Premature atrial complexes Rightward axis Pulmonary disease pattern Nonspecific ST abnormality Abnormal ECG No old tracing to compare Confirmed by Calvert Cantor [phone]) on 02/26/2020 3:22:35 PM   Radiology DG Chest 2 View  Result Date: 02/26/2020 CLINICAL DATA:  Chest pain EXAM: CHEST - 2 VIEW COMPARISON:  CT chest dated 10/13/2017 FINDINGS: Lungs are clear.  No pleural effusion or pneumothorax. The heart is normal in size. Mild degenerative changes of the visualized thoracolumbar spine. IMPRESSION: Normal chest radiographs. Electronically Signed   By: Julian Hy M.D.   On: 02/26/2020 13:07    Procedures Procedures  Medications Ordered in the ED Medications - No data to display   MDM Rules/Calculators/A&P MDM Patient with jaw pain, no chest pain or other concerning symptoms. Initial EKG without ischemic changes but no old for comparison. Initial labs, CXR neg including first Trop. Will check second Trop and if neg, anticipate  discharge.   ED Course  I have reviewed the triage vital signs and the nursing notes.  Pertinent labs & imaging results that were available during my care of the patient were reviewed by me and considered in my medical decision making (see chart for details).  Clinical Course as of 02/26/20 1653  Sun Feb 26, 2020  1650 Second Trop is negative. Patient remains asymptomatic. She suspects her jaw pain may be related to her  polyarthralgia. Recommend PCP follow up. RTED for any other concerns.  [CS]    Clinical Course User Index [CS] Truddie Hidden, MD    Final Clinical Impression(s) / ED Diagnoses Final diagnoses:  Jaw pain    Rx / DC Orders ED Discharge Orders    None       Truddie Hidden, MD 02/26/20 [phone]

## 2020-03-01 ENCOUNTER — Encounter: Payer: PPO | Admitting: Gastroenterology

## 2020-03-20 ENCOUNTER — Ambulatory Visit: Payer: Self-pay | Admitting: Cardiology

## 2020-03-29 ENCOUNTER — Encounter: Payer: Self-pay | Admitting: Gastroenterology

## 2020-04-04 ENCOUNTER — Ambulatory Visit: Payer: PPO | Admitting: Neurology

## 2020-04-12 ENCOUNTER — Other Ambulatory Visit: Payer: Self-pay

## 2020-04-12 ENCOUNTER — Encounter: Payer: Self-pay | Admitting: Gastroenterology

## 2020-04-12 ENCOUNTER — Ambulatory Visit (AMBULATORY_SURGERY_CENTER): Payer: PPO | Admitting: Gastroenterology

## 2020-04-12 VITALS — BP 135/88 | HR 52 | Temp 97.8°F | Resp 14 | Ht 60.75 in | Wt 124.0 lb

## 2020-04-12 DIAGNOSIS — K2100 Gastro-esophageal reflux disease with esophagitis, without bleeding: Secondary | ICD-10-CM

## 2020-04-12 DIAGNOSIS — K259 Gastric ulcer, unspecified as acute or chronic, without hemorrhage or perforation: Secondary | ICD-10-CM

## 2020-04-12 DIAGNOSIS — D120 Benign neoplasm of cecum: Secondary | ICD-10-CM | POA: Diagnosis not present

## 2020-04-12 DIAGNOSIS — K449 Diaphragmatic hernia without obstruction or gangrene: Secondary | ICD-10-CM | POA: Diagnosis not present

## 2020-04-12 DIAGNOSIS — R1310 Dysphagia, unspecified: Secondary | ICD-10-CM | POA: Diagnosis not present

## 2020-04-12 DIAGNOSIS — D122 Benign neoplasm of ascending colon: Secondary | ICD-10-CM | POA: Diagnosis not present

## 2020-04-12 DIAGNOSIS — Z86010 Personal history of colonic polyps: Secondary | ICD-10-CM | POA: Diagnosis not present

## 2020-04-12 DIAGNOSIS — K319 Disease of stomach and duodenum, unspecified: Secondary | ICD-10-CM | POA: Diagnosis not present

## 2020-04-12 DIAGNOSIS — K3189 Other diseases of stomach and duodenum: Secondary | ICD-10-CM | POA: Diagnosis not present

## 2020-04-12 DIAGNOSIS — R1032 Left lower quadrant pain: Secondary | ICD-10-CM

## 2020-04-12 DIAGNOSIS — D123 Benign neoplasm of transverse colon: Secondary | ICD-10-CM | POA: Diagnosis not present

## 2020-04-12 MED ORDER — OMEPRAZOLE 40 MG PO CPDR: 40.0000 mg | DELAYED_RELEASE_CAPSULE | Freq: Every day | ORAL | 3 refills | Status: DC

## 2020-04-12 MED ORDER — SODIUM CHLORIDE 0.9 % IV SOLN: 500.0000 mL | Freq: Once | INTRAVENOUS | Status: DC

## 2020-04-12 NOTE — Progress Notes (Signed)
Called to room to assist during endoscopic procedure.  Patient ID and intended procedure confirmed with present staff. Received instructions for my participation in the procedure from the performing physician.  

## 2020-04-12 NOTE — Progress Notes (Signed)
Patient had several questions during discharge.  Friend understood instructions. Patient talked throughout instructions.

## 2020-04-12 NOTE — Progress Notes (Signed)
Medical history reviewed with no changes noted. VS assessed by C.W 

## 2020-04-12 NOTE — Op Note (Signed)
[facility] [place] Patient Name: Melissa Frey Procedure Date: 04/12/2020 2:10 PM MRN: [id number] Endoscopist: Remo Lipps P. Havery Moros , MD Age: 75 Referring MD:  Date of Birth: 1945/02/07 Gender: Female Account #: [id number] Procedure:                Upper GI endoscopy Indications:              Dysphagia Medicines:                Monitored Anesthesia Care Procedure:                Pre-Anesthesia Assessment:                           - Prior to the procedure, a History and Physical                            was performed, and patient medications and                            allergies were reviewed. The patient's tolerance of                            previous anesthesia was also reviewed. The risks                            and benefits of the procedure and the sedation                            options and risks were discussed with the patient.                            All questions were answered, and informed consent                            was obtained. Prior Anticoagulants: The patient has                            taken no previous anticoagulant or antiplatelet                            agents. ASA Grade Assessment: II - A patient with                            mild systemic disease. After reviewing the risks                            and benefits, the patient was deemed in                            satisfactory condition to undergo the procedure.                           After obtaining informed consent, the endoscope was  passed under direct vision. Throughout the                            procedure, the patient's blood pressure, pulse, and                            oxygen saturations were monitored continuously. The                            Endoscope was introduced through the mouth, and                            advanced to the second part of duodenum. The upper                            GI endoscopy was accomplished without  difficulty.                            The patient tolerated the procedure well. Scope In: Scope Out: Findings:                 Esophagogastric landmarks were identified: the                            Z-line was found at 30 cm, the gastroesophageal                            junction was found at 30 cm and the upper extent of                            the gastric folds was found at 35 cm from the                            incisors.                           A 5 cm hiatal hernia was present.                           LA Grade A esophagitis was found 30 cm from the                            incisors.                           One benign-appearing, intrinsic mild stenosis was                            found 30 cm from the incisors. This stenosis                            measured less than one cm (in length). A guidewire  was placed and the scope was withdrawn. Dilation                            was performed with a Savary dilator with mild                            resistance at 17 mm (as opposed to a balloon in                            case proximal subtle stenosis related to symptoms).                            Relook endoscopy showed appropriate mucosal wrent                            at the stricture                           The exam of the esophagus was otherwise normal.                           Three cratered gastric ulcers were found in the                            gastric antrum. The largest lesion was 3 mm in                            largest dimension. Biopsies were taken with a cold                            forceps for histology.                           The exam of the stomach was otherwise normal.                           Biopsies were taken with a cold forceps in the                            gastric body, at the incisura and in the gastric                            antrum for Helicobacter pylori testing.                            The duodenal bulb and second portion of the                            duodenum were normal. Complications:            No immediate complications. Estimated blood loss:                            Minimal. Estimated Blood Loss:  Estimated blood loss was minimal. Impression:               - Esophagogastric landmarks identified.                           - 5 cm hiatal hernia.                           - LA Grade A reflux esophagitis.                           - Benign-appearing esophageal stenosis. Dilated to                            56mm with good result.                           - Gastric ulcers. Biopsied.                           - Normal stomach otherwise - biopsies taken to rule                            out H pylori                           - Normal duodenal bulb and second portion of the                            duodenum. Recommendation:           - Patient has a contact number available for                            emergencies. The signs and symptoms of potential                            delayed complications were discussed with the                            patient. Return to normal activities tomorrow.                            Written discharge instructions were provided to the                            patient.                           - Post dilation diet.                           - Continue present medications.                           - Start omeprazole 40mg  once daily                           -  Await pathology results. Remo Lipps P. Torben Soloway, MD 04/12/2020 3:07:21 PM This report has been signed electronically.

## 2020-04-12 NOTE — Op Note (Signed)
[facility] Patient Name: Melissa Frey Procedure Date: 04/12/2020 1:52 PM MRN: [id number] Endoscopist: Remo Lipps P. Havery Moros , MD Age: 75 Referring MD:  Date of Birth: 07-30-1945 Gender: Female Account #: [id number] Procedure:                Colonoscopy Indications:              High risk colon cancer surveillance: Personal                            history of colonic polyps (4 adenomas / sessile                            serrated polyps removed in 02/2015) Medicines:                Monitored Anesthesia Care Procedure:                Pre-Anesthesia Assessment:                           - Prior to the procedure, a History and Physical                            was performed, and patient medications and                            allergies were reviewed. The patient's tolerance of                            previous anesthesia was also reviewed. The risks                            and benefits of the procedure and the sedation                            options and risks were discussed with the patient.                            All questions were answered, and informed consent                            was obtained. Prior Anticoagulants: The patient has                            taken no previous anticoagulant or antiplatelet                            agents. ASA Grade Assessment: II - A patient with                            mild systemic disease. After reviewing the risks                            and benefits, the patient was deemed in  satisfactory condition to undergo the procedure.                           After obtaining informed consent, the colonoscope                            was passed under direct vision. Throughout the                            procedure, the patient's blood pressure, pulse, and                            oxygen saturations were monitored continuously. The                            Olympus PCF-[id number]  (#[id number]) Colonoscope was                            introduced through the anus and advanced to the the                            cecum, identified by appendiceal orifice and                            ileocecal valve. The patient tolerated the                            procedure well. The colonoscopy was technically                            difficult and complex due to restricted mobility of                            the colon. Scope In: 2:23:33 PM Scope Out: 2:55:01 PM Scope Withdrawal Time: 0 hours 12 minutes 20 seconds  Total Procedure Duration: 0 hours 31 minutes 28 seconds  Findings:                 The perianal and digital rectal examinations were                            normal.                           Two sessile polyps were found in the cecum. The                            polyps were diminutive in size. These polyps were                            removed with a cold snare. Resection and retrieval                            were complete.  A diminutive polyp was found in the ascending                            colon. The polyp was sessile. The polyp was removed                            with a cold snare. Resection and retrieval were                            complete.                           Multiple medium-mouthed diverticula were found in                            the entire colon, highest burden in the left colon                            where there was restricted mobility of the colon. A                            pediatric colonoscope could not traverse the area                            due to luminal narrowing. An upper endoscope was                            used to traverse the sigmoid colon and achieve                            cecal intubation, which prolonged the exam.                           Internal hemorrhoids were found.                           The exam was otherwise without abnormality. Complications:             No immediate complications. Estimated blood loss:                            Minimal. Estimated Blood Loss:     Estimated blood loss was minimal. Impression:               - Two diminutive polyps in the cecum, removed with                            a cold snare. Resected and retrieved.                           - One diminutive polyp in the ascending colon,                            removed with a cold snare. Resected and retrieved.                           -  Diverticulosis in the entire examined colon, with                            luminal narrowing of the left colon requiring upper                            endoscope to complete the exam.                           - Internal hemorrhoids.                           - The examination was otherwise normal. Recommendation:           - Patient has a contact number available for                            emergencies. The signs and symptoms of potential                            delayed complications were discussed with the                            patient. Return to normal activities tomorrow.                            Written discharge instructions were provided to the                            patient.                           - Resume previous diet.                           - Continue present medications.                           - Await pathology results. Remo Lipps P. Jochebed Bills, MD 04/12/2020 3:00:17 PM This report has been signed electronically.

## 2020-04-12 NOTE — Patient Instructions (Signed)
Try to avoid advil, aspirin and aleve if possible.  Read all of the handouts given to you by your recovery room nurse. Be sure to take your medication as directed.  YOU HAD AN ENDOSCOPIC PROCEDURE TODAY AT [facility]:   Refer to the procedure report that was given to you for any specific questions about what was found during the examination.  If the procedure report does not answer your questions, please call your gastroenterologist to clarify.  If you requested that your care partner not be given the details of your procedure findings, then the procedure report has been included in a sealed envelope for you to review at your convenience later.  YOU SHOULD EXPECT: Some feelings of bloating in the abdomen. Passage of more gas than usual.  Walking can help get rid of the air that was put into your GI tract during the procedure and reduce the bloating. If you had a lower endoscopy (such as a colonoscopy or flexible sigmoidoscopy) you may notice spotting of blood in your stool or on the toilet paper. If you underwent a bowel prep for your procedure, you may not have a normal bowel movement for a few days.  Please Note:  You might notice some irritation and congestion in your nose or some drainage.  This is from the oxygen used during your procedure.  There is no need for concern and it should clear up in a day or so.  SYMPTOMS TO REPORT IMMEDIATELY:   Following lower endoscopy (colonoscopy or flexible sigmoidoscopy):  Excessive amounts of blood in the stool  Significant tenderness or worsening of abdominal pains  Swelling of the abdomen that is new, acute  Fever of 100F or higher   Following upper endoscopy (EGD)  Vomiting of blood or coffee ground material  New chest pain or pain under the shoulder blades  Painful or persistently difficult swallowing  New shortness of breath  Fever of 100F or higher  Black, tarry-looking stools  For urgent or emergent issues, a  gastroenterologist can be reached at any hour by calling [phone]. Do not use MyChart messaging for urgent concerns.    DIET:  We do recommend clear liquids until 4:15 p m.  Then have a soft diet for the rest of today. You may proceed to your regular diet tomorrow..  Drink plenty of fluids but you should avoid alcoholic beverages for 24 hours.  ACTIVITY:  You should plan to take it easy for the rest of today and you should NOT DRIVE or use heavy machinery until tomorrow (because of the sedation medicines used during the test).    FOLLOW UP: Our staff will call the number listed on your records 48-72 hours following your procedure to check on you and address any questions or concerns that you may have regarding the information given to you following your procedure. If we do not reach you, we will leave a message.  We will attempt to reach you two times.  During this call, we will ask if you have developed any symptoms of COVID 19. If you develop any symptoms (ie: fever, flu-like symptoms, shortness of breath, cough etc.) before then, please call [phone].  If you test positive for Covid 19 in the 2 weeks post procedure, please call and report this information to [place].    If any biopsies were taken you will be contacted by phone or by letter within the next 1-3 weeks.  Please call us at [phone] if you  have not heard about the biopsies in 3 weeks.    SIGNATURES/CONFIDENTIALITY: You and/or your care partner have signed paperwork which will be entered into your electronic medical record.  These signatures attest to the fact that that the information above on your After Visit Summary has been reviewed and is understood.  Full responsibility of the confidentiality of this discharge information lies with you and/or your care-partner.

## 2020-04-12 NOTE — Progress Notes (Signed)
A/ox3, pleased with MAC, report to RN 

## 2020-04-17 ENCOUNTER — Telehealth: Payer: Self-pay

## 2020-04-17 NOTE — Telephone Encounter (Signed)
First attempt follow up call to pt, lm on vm 

## 2020-04-17 NOTE — Telephone Encounter (Signed)
  Follow up Call-  Call back number 04/12/2020 08/25/2017  Post procedure Call Back phone  # [phone]  Permission to leave phone message Yes Yes  Some recent data might be hidden     Left message

## 2020-05-24 DIAGNOSIS — N952 Postmenopausal atrophic vaginitis: Secondary | ICD-10-CM | POA: Diagnosis not present

## 2020-05-24 DIAGNOSIS — L900 Lichen sclerosus et atrophicus: Secondary | ICD-10-CM | POA: Diagnosis not present

## 2020-07-01 DIAGNOSIS — R0781 Pleurodynia: Secondary | ICD-10-CM | POA: Diagnosis not present

## 2020-07-01 DIAGNOSIS — M546 Pain in thoracic spine: Secondary | ICD-10-CM | POA: Diagnosis not present

## 2020-07-06 DIAGNOSIS — K5900 Constipation, unspecified: Secondary | ICD-10-CM | POA: Diagnosis not present

## 2020-07-06 DIAGNOSIS — M545 Low back pain, unspecified: Secondary | ICD-10-CM | POA: Diagnosis not present

## 2020-07-06 DIAGNOSIS — Z86010 Personal history of colonic polyps: Secondary | ICD-10-CM | POA: Diagnosis not present

## 2020-07-17 DIAGNOSIS — M546 Pain in thoracic spine: Secondary | ICD-10-CM | POA: Diagnosis not present

## 2020-07-17 DIAGNOSIS — M810 Age-related osteoporosis without current pathological fracture: Secondary | ICD-10-CM | POA: Diagnosis not present

## 2020-07-17 DIAGNOSIS — M545 Low back pain, unspecified: Secondary | ICD-10-CM | POA: Diagnosis not present

## 2020-07-17 DIAGNOSIS — M8088XA Other osteoporosis with current pathological fracture, vertebra(e), initial encounter for fracture: Secondary | ICD-10-CM | POA: Diagnosis not present

## 2020-07-17 DIAGNOSIS — S22000A Wedge compression fracture of unspecified thoracic vertebra, initial encounter for closed fracture: Secondary | ICD-10-CM | POA: Diagnosis not present

## 2020-07-17 DIAGNOSIS — N200 Calculus of kidney: Secondary | ICD-10-CM | POA: Diagnosis not present

## 2020-07-27 ENCOUNTER — Other Ambulatory Visit (HOSPITAL_COMMUNITY): Payer: Self-pay | Admitting: *Deleted

## 2020-07-30 ENCOUNTER — Inpatient Hospital Stay (HOSPITAL_COMMUNITY): Admission: RE | Admit: 2020-07-30 | Payer: PPO | Source: Ambulatory Visit

## 2020-07-30 ENCOUNTER — Encounter (HOSPITAL_COMMUNITY): Payer: Self-pay

## 2020-08-15 DIAGNOSIS — M2550 Pain in unspecified joint: Secondary | ICD-10-CM | POA: Diagnosis not present

## 2020-08-15 DIAGNOSIS — U071 COVID-19: Secondary | ICD-10-CM | POA: Diagnosis not present

## 2020-08-27 DIAGNOSIS — S32040A Wedge compression fracture of fourth lumbar vertebra, initial encounter for closed fracture: Secondary | ICD-10-CM | POA: Diagnosis not present

## 2020-08-27 DIAGNOSIS — Z8616 Personal history of COVID-19: Secondary | ICD-10-CM | POA: Diagnosis not present

## 2020-09-04 DIAGNOSIS — S22000A Wedge compression fracture of unspecified thoracic vertebra, initial encounter for closed fracture: Secondary | ICD-10-CM | POA: Diagnosis not present

## 2020-09-04 DIAGNOSIS — M546 Pain in thoracic spine: Secondary | ICD-10-CM | POA: Diagnosis not present

## 2020-09-04 DIAGNOSIS — M810 Age-related osteoporosis without current pathological fracture: Secondary | ICD-10-CM | POA: Diagnosis not present

## 2020-11-27 DIAGNOSIS — Z1231 Encounter for screening mammogram for malignant neoplasm of breast: Secondary | ICD-10-CM | POA: Diagnosis not present

## 2020-11-27 DIAGNOSIS — E785 Hyperlipidemia, unspecified: Secondary | ICD-10-CM | POA: Diagnosis not present

## 2020-11-27 DIAGNOSIS — M810 Age-related osteoporosis without current pathological fracture: Secondary | ICD-10-CM | POA: Diagnosis not present

## 2020-12-04 DIAGNOSIS — I700 Atherosclerosis of aorta: Secondary | ICD-10-CM | POA: Diagnosis not present

## 2020-12-04 DIAGNOSIS — S22000A Wedge compression fracture of unspecified thoracic vertebra, initial encounter for closed fracture: Secondary | ICD-10-CM | POA: Diagnosis not present

## 2020-12-04 DIAGNOSIS — Z1331 Encounter for screening for depression: Secondary | ICD-10-CM | POA: Diagnosis not present

## 2020-12-04 DIAGNOSIS — L900 Lichen sclerosus et atrophicus: Secondary | ICD-10-CM | POA: Diagnosis not present

## 2020-12-04 DIAGNOSIS — Z0000 Encounter for general adult medical examination without abnormal findings: Secondary | ICD-10-CM | POA: Diagnosis not present

## 2020-12-04 DIAGNOSIS — E785 Hyperlipidemia, unspecified: Secondary | ICD-10-CM | POA: Diagnosis not present

## 2020-12-04 DIAGNOSIS — Z1389 Encounter for screening for other disorder: Secondary | ICD-10-CM | POA: Diagnosis not present

## 2020-12-04 DIAGNOSIS — M810 Age-related osteoporosis without current pathological fracture: Secondary | ICD-10-CM | POA: Diagnosis not present

## 2020-12-14 DIAGNOSIS — R82998 Other abnormal findings in urine: Secondary | ICD-10-CM | POA: Diagnosis not present

## 2021-04-08 DIAGNOSIS — H2512 Age-related nuclear cataract, left eye: Secondary | ICD-10-CM | POA: Diagnosis not present

## 2021-04-08 DIAGNOSIS — H11002 Unspecified pterygium of left eye: Secondary | ICD-10-CM | POA: Diagnosis not present

## 2021-04-08 DIAGNOSIS — H26491 Other secondary cataract, right eye: Secondary | ICD-10-CM | POA: Diagnosis not present

## 2021-04-08 DIAGNOSIS — H5211 Myopia, right eye: Secondary | ICD-10-CM | POA: Diagnosis not present

## 2021-04-09 ENCOUNTER — Ambulatory Visit: Payer: PPO | Admitting: Podiatry

## 2021-04-09 ENCOUNTER — Ambulatory Visit (INDEPENDENT_AMBULATORY_CARE_PROVIDER_SITE_OTHER): Payer: PPO

## 2021-04-09 ENCOUNTER — Other Ambulatory Visit: Payer: Self-pay

## 2021-04-09 ENCOUNTER — Encounter: Payer: Self-pay | Admitting: Podiatry

## 2021-04-09 DIAGNOSIS — S32040A Wedge compression fracture of fourth lumbar vertebra, initial encounter for closed fracture: Secondary | ICD-10-CM | POA: Insufficient documentation

## 2021-04-09 DIAGNOSIS — M778 Other enthesopathies, not elsewhere classified: Secondary | ICD-10-CM | POA: Diagnosis not present

## 2021-04-09 DIAGNOSIS — M546 Pain in thoracic spine: Secondary | ICD-10-CM | POA: Insufficient documentation

## 2021-04-09 DIAGNOSIS — K5900 Constipation, unspecified: Secondary | ICD-10-CM | POA: Insufficient documentation

## 2021-04-09 DIAGNOSIS — N200 Calculus of kidney: Secondary | ICD-10-CM | POA: Insufficient documentation

## 2021-04-09 DIAGNOSIS — R195 Other fecal abnormalities: Secondary | ICD-10-CM | POA: Insufficient documentation

## 2021-04-09 DIAGNOSIS — M799 Soft tissue disorder, unspecified: Secondary | ICD-10-CM | POA: Insufficient documentation

## 2021-04-09 DIAGNOSIS — I700 Atherosclerosis of aorta: Secondary | ICD-10-CM | POA: Insufficient documentation

## 2021-04-09 DIAGNOSIS — M2550 Pain in unspecified joint: Secondary | ICD-10-CM | POA: Insufficient documentation

## 2021-04-09 DIAGNOSIS — M545 Low back pain, unspecified: Secondary | ICD-10-CM | POA: Insufficient documentation

## 2021-04-09 DIAGNOSIS — M8448XA Pathological fracture, other site, initial encounter for fracture: Secondary | ICD-10-CM | POA: Insufficient documentation

## 2021-04-09 DIAGNOSIS — S22000A Wedge compression fracture of unspecified thoracic vertebra, initial encounter for closed fracture: Secondary | ICD-10-CM | POA: Insufficient documentation

## 2021-04-09 MED ORDER — DEXAMETHASONE SODIUM PHOSPHATE 120 MG/30ML IJ SOLN
2.0000 mg | Freq: Once | INTRAMUSCULAR | Status: AC
  Administered 2021-04-09: 2 mg via INTRA_ARTICULAR

## 2021-04-09 NOTE — Progress Notes (Signed)
?Subjective:  ?Patient ID: Melissa Frey, female    DOB: 19-Dec-1945,  MRN: [id number] ?HPI ?Chief Complaint  ?Patient presents with  ? Foot Pain  ?  Plantar forefoot bilateral (L>R) - multiple deformities x years, feels puffy under toes, hard to walk barefoot, feels knot in arch of right   ? New Patient (Initial Visit)  ?  Est pt 2016  ? ? ?76 y.o. female presents with the above complaint.  ? ?ROS: She denies fever chills nausea vomit muscle aches pains calf pain back pain chest pain shortness of breath. ? ?Past Medical History:  ?Diagnosis Date  ? Cataract   ? right cateract removed  ? Diverticulosis   ? Dysphagia   ? Gastritis   ? GERD (gastroesophageal reflux disease)   ? History of colon polyps   ? Hyperlipidemia   ? Internal hemorrhoids   ? Osteoporosis   ? ?Past Surgical History:  ?Procedure Laterality Date  ? [place]    ? CATARACT EXTRACTION  04/2013  ? CESAREAN SECTION  1977/1983  ? x3  ? FOOT SURGERY Right 02/2012  ? HEMORRHOID BANDING    ? skin cancer removal    ? face  ? ? ?Current Outpatient Medications:  ?  acetaminophen (TYLENOL) 650 MG CR tablet, Take 1,300 mg by mouth at bedtime., Disp: , Rfl:  ?  alendronate (FOSAMAX) 70 MG tablet, Take 70 mg by mouth once a week., Disp: , Rfl:  ?  BIOTIN PO, Take 1 tablet by mouth daily., Disp: , Rfl:  ?  Calcium Carbonate-Vitamin D (CALCIUM-D PO), Take 1 tablet by mouth daily., Disp: , Rfl:  ?  Coenzyme Q10 (COQ10 PO), Take 1 capsule by mouth daily., Disp: , Rfl:  ?  conjugated estrogens (PREMARIN) vaginal cream, Place 1 Applicatorful vaginally as needed (dryness)., Disp: , Rfl:  ?  Lidocaine HCl-Benzyl Alcohol (SALONPAS LIDOCAINE PLUS EX), Apply 1 application. topically at bedtime as needed (pain)., Disp: , Rfl:  ?  Multiple Vitamin (MULTIVITAMIN WITH MINERALS) TABS tablet, Take 1 tablet by mouth daily., Disp: , Rfl:  ?  rosuvastatin (CRESTOR) 10 MG tablet, Take 1 tablet by mouth once daily (Patient taking differently: Take 10 mg by mouth at  bedtime.), Disp: 90 tablet, Rfl: 0 ?  TURMERIC PO, Take 2 capsules by mouth daily., Disp: , Rfl:  ? ?Allergies  ?Allergen Reactions  ? Covid-19 Mrna Vacc (Moderna) Swelling  ?  Reaction to booster shot 12/19/2019 - severe swelling/arthritis/joint pain/raised bumps - no reaction to 1st 2 shots  ? ?Review of Systems ?Objective:  ?There were no vitals filed for this visit. ? ?General: Well developed, nourished, in no acute distress, alert and oriented x3  ? ?Dermatological: Skin is warm, dry and supple bilateral. Nails x 10 are well maintained; remaining integument appears unremarkable at this time. There are no open sores, no preulcerative lesions, no rash or signs of infection present. ? ?Vascular: Dorsalis Pedis artery and Posterior Tibial artery pedal pulses are 2/4 bilateral with immedate capillary fill time. Pedal hair growth present. No varicosities and no lower extremity edema present bilateral.  ? ?Neruologic: Grossly intact via light touch bilateral. Vibratory intact via tuning fork bilateral. Protective threshold with Semmes Wienstein monofilament intact to all pedal sites bilateral. Patellar and Achilles deep tendon reflexes 2+ bilateral. No Babinski or clonus noted bilateral.  ? ?Musculoskeletal: No gross boney pedal deformities bilateral. No pain, crepitus, or limitation noted with foot and ankle range of motion bilateral. Muscular strength 5/5 in all groups  tested bilateral.  She has severe digital deformities left over right.  She has had a surgical correction of a Lapidus procedure of her right foot which is gone on to heal uneventfully she still has hallux valgus deformity with under lapping second toe.  Left foot demonstrates a very severe bunion deformity approximately 40 degrees and the hallux is laying up against the second toe left.  The majority of her tenderness is beneath the second metatarsal phalangeal joints bilateral left greater than right.  She has pain on end range of motion of both  second toes bilaterally.  Left is worse than the right. ? ?Gait: Unassisted, Nonantalgic.  ? ? ?Radiographs: ? ?Radiographs today demonstrate an osseously mature individual with internal fixation first metatarsal medial cuneiform joint and second metatarsal right foot.  Still has overlapping toes but consolidation at the metatarsal cuneiform level. ? ?Left foot demonstrates severe hallux valgus deformity tibial sesamoid is even out in the interspace.  She has hammertoe deformities and then medial deviation of the second toe and dorsal displacement as well. ? ?Assessment & Plan:  ? ?Assessment: Capsulitis of the second metatarsal phalangeal joint bilateral severe bunion deformities bilateral. ? ?Plan: Discussed etiology pathology conservative versus surgical therapies.  At this point I recommended injection to the second metatarsophalangeal joint of the left foot since that one is primarily her worse foot.  We injected this with dexamethasone local anesthetic she tolerated procedure well I will follow-up with her on an as-needed basis. ? ? ? ? ?Sherrill Mckamie T. [place], DPM ?

## 2021-04-15 DIAGNOSIS — R5383 Other fatigue: Secondary | ICD-10-CM | POA: Diagnosis not present

## 2021-04-15 DIAGNOSIS — R059 Cough, unspecified: Secondary | ICD-10-CM | POA: Diagnosis not present

## 2021-04-15 DIAGNOSIS — Z1152 Encounter for screening for COVID-19: Secondary | ICD-10-CM | POA: Diagnosis not present

## 2021-04-15 DIAGNOSIS — J029 Acute pharyngitis, unspecified: Secondary | ICD-10-CM | POA: Diagnosis not present

## 2021-04-15 DIAGNOSIS — J069 Acute upper respiratory infection, unspecified: Secondary | ICD-10-CM | POA: Diagnosis not present

## 2021-04-15 DIAGNOSIS — R197 Diarrhea, unspecified: Secondary | ICD-10-CM | POA: Diagnosis not present

## 2021-04-19 ENCOUNTER — Other Ambulatory Visit: Payer: Self-pay | Admitting: Family Medicine

## 2021-04-19 DIAGNOSIS — R109 Unspecified abdominal pain: Secondary | ICD-10-CM

## 2021-04-19 DIAGNOSIS — I700 Atherosclerosis of aorta: Secondary | ICD-10-CM | POA: Diagnosis not present

## 2021-04-26 ENCOUNTER — Ambulatory Visit
Admission: RE | Admit: 2021-04-26 | Discharge: 2021-04-26 | Disposition: A | Discharge status: Home / Self Care | Payer: PPO | Source: Ambulatory Visit | Attending: Family Medicine | Admitting: Family Medicine

## 2021-04-26 ENCOUNTER — Other Ambulatory Visit: Payer: Self-pay | Admitting: Family Medicine

## 2021-04-26 DIAGNOSIS — R109 Unspecified abdominal pain: Secondary | ICD-10-CM

## 2021-05-01 DIAGNOSIS — H26491 Other secondary cataract, right eye: Secondary | ICD-10-CM | POA: Diagnosis not present

## 2021-05-06 ENCOUNTER — Other Ambulatory Visit: Payer: Self-pay | Admitting: Internal Medicine

## 2021-05-06 DIAGNOSIS — I714 Abdominal aortic aneurysm, without rupture, unspecified: Secondary | ICD-10-CM

## 2021-05-06 DIAGNOSIS — R109 Unspecified abdominal pain: Secondary | ICD-10-CM

## 2021-05-16 ENCOUNTER — Ambulatory Visit
Admission: RE | Admit: 2021-05-16 | Discharge: 2021-05-16 | Disposition: A | Discharge status: Home / Self Care | Payer: HMO | Source: Ambulatory Visit | Attending: Internal Medicine | Admitting: Internal Medicine

## 2021-05-16 DIAGNOSIS — R109 Unspecified abdominal pain: Secondary | ICD-10-CM

## 2021-05-16 DIAGNOSIS — R1900 Intra-abdominal and pelvic swelling, mass and lump, unspecified site: Secondary | ICD-10-CM | POA: Diagnosis not present

## 2021-05-16 DIAGNOSIS — R1906 Epigastric swelling, mass or lump: Secondary | ICD-10-CM | POA: Diagnosis not present

## 2021-05-16 DIAGNOSIS — I714 Abdominal aortic aneurysm, without rupture, unspecified: Secondary | ICD-10-CM

## 2021-06-26 DIAGNOSIS — L570 Actinic keratosis: Secondary | ICD-10-CM | POA: Diagnosis not present

## 2021-06-26 DIAGNOSIS — D045 Carcinoma in situ of skin of trunk: Secondary | ICD-10-CM | POA: Diagnosis not present

## 2021-06-26 DIAGNOSIS — D485 Neoplasm of uncertain behavior of skin: Secondary | ICD-10-CM | POA: Diagnosis not present

## 2021-06-27 ENCOUNTER — Telehealth: Payer: Self-pay | Admitting: *Deleted

## 2021-06-27 NOTE — Telephone Encounter (Signed)
Patient is calling wanting to schedule a f/u appointment w/ Dr Milinda Pointer.she is still having problems with her feet,is affecting the way that she walks.

## 2021-06-28 NOTE — Telephone Encounter (Signed)
Pt is scheduled for 6/8 at 430 with Dr Milinda Pointer in [place].  Thank you

## 2021-07-11 ENCOUNTER — Ambulatory Visit: Payer: PPO | Admitting: Podiatry

## 2021-07-15 DIAGNOSIS — M1612 Unilateral primary osteoarthritis, left hip: Secondary | ICD-10-CM | POA: Diagnosis not present

## 2021-07-15 DIAGNOSIS — M545 Low back pain, unspecified: Secondary | ICD-10-CM | POA: Diagnosis not present

## 2021-07-15 DIAGNOSIS — M8088XA Other osteoporosis with current pathological fracture, vertebra(e), initial encounter for fracture: Secondary | ICD-10-CM | POA: Diagnosis not present

## 2021-07-15 DIAGNOSIS — M25552 Pain in left hip: Secondary | ICD-10-CM | POA: Diagnosis not present

## 2021-07-18 DIAGNOSIS — H269 Unspecified cataract: Secondary | ICD-10-CM | POA: Diagnosis not present

## 2021-07-18 DIAGNOSIS — H2512 Age-related nuclear cataract, left eye: Secondary | ICD-10-CM | POA: Diagnosis not present

## 2021-08-01 ENCOUNTER — Ambulatory Visit: Payer: PPO | Admitting: Podiatry

## 2021-08-13 DIAGNOSIS — W57XXXA Bitten or stung by nonvenomous insect and other nonvenomous arthropods, initial encounter: Secondary | ICD-10-CM | POA: Diagnosis not present

## 2021-08-13 DIAGNOSIS — D045 Carcinoma in situ of skin of trunk: Secondary | ICD-10-CM | POA: Diagnosis not present

## 2021-08-26 ENCOUNTER — Encounter: Payer: Self-pay | Admitting: Gastroenterology

## 2021-09-17 ENCOUNTER — Encounter: Payer: Self-pay | Admitting: Podiatry

## 2021-09-17 ENCOUNTER — Ambulatory Visit: Payer: PPO | Admitting: Podiatry

## 2021-09-17 DIAGNOSIS — M778 Other enthesopathies, not elsewhere classified: Secondary | ICD-10-CM | POA: Diagnosis not present

## 2021-09-17 DIAGNOSIS — M2012 Hallux valgus (acquired), left foot: Secondary | ICD-10-CM | POA: Diagnosis not present

## 2021-09-17 DIAGNOSIS — M2011 Hallux valgus (acquired), right foot: Secondary | ICD-10-CM | POA: Diagnosis not present

## 2021-09-17 NOTE — Progress Notes (Signed)
She presents today states that she would like to get some orthotics she states that she is doing much better with her severe bunion deformities and her hammertoe deformities since acupuncture.  She is very happy with that thus far.  She states that she would like to have some orthotics to help her with the occasional capsulitis in that second metatarsophalangeal joint.  Objective: Severe hallux valgus deformities bilateral with overlapping toes.  She still has some tenderness on palpation of the second metatarsal phalangeal joint.  Assessment: Pain in limb secondary to digital deformities.  Plan: She was casted for orthotics today.

## 2021-10-17 ENCOUNTER — Ambulatory Visit (INDEPENDENT_AMBULATORY_CARE_PROVIDER_SITE_OTHER): Payer: PPO

## 2021-10-17 DIAGNOSIS — M778 Other enthesopathies, not elsewhere classified: Secondary | ICD-10-CM

## 2021-10-17 NOTE — Progress Notes (Signed)
Patient presents today to pick up custom molded foot orthotics, diagnosed with capsulitis by Dr. Milinda Pointer.   Orthotics were dispensed and fit was satisfactory. Reviewed instructions for break-in and wear. Written instructions given to patient.  Patient will follow up as needed.   [facility] - order # [id number]

## 2021-10-31 ENCOUNTER — Ambulatory Visit (INDEPENDENT_AMBULATORY_CARE_PROVIDER_SITE_OTHER): Payer: PPO

## 2021-10-31 DIAGNOSIS — M778 Other enthesopathies, not elsewhere classified: Secondary | ICD-10-CM

## 2021-10-31 NOTE — Progress Notes (Signed)
Patient in office today due to orthotics being uncomfortable.   Patient reports adding extra padding in the arch area of her old orthotics that makes them extremely comfortable. Patient states she would like the padding added to her new orthotics to make them feel like the old ones.   Called [facility] to see if this could be done. Was told to take a picture of the padding on the old orthotic and send them in with the orthotics to duplicated.   Will call the patient when adjustments are completed.

## 2021-11-12 DIAGNOSIS — L814 Other melanin hyperpigmentation: Secondary | ICD-10-CM | POA: Diagnosis not present

## 2021-11-12 DIAGNOSIS — D239 Other benign neoplasm of skin, unspecified: Secondary | ICD-10-CM | POA: Diagnosis not present

## 2021-11-12 DIAGNOSIS — L570 Actinic keratosis: Secondary | ICD-10-CM | POA: Diagnosis not present

## 2021-11-12 DIAGNOSIS — D485 Neoplasm of uncertain behavior of skin: Secondary | ICD-10-CM | POA: Diagnosis not present

## 2021-11-12 DIAGNOSIS — D225 Melanocytic nevi of trunk: Secondary | ICD-10-CM | POA: Diagnosis not present

## 2021-11-12 DIAGNOSIS — L821 Other seborrheic keratosis: Secondary | ICD-10-CM | POA: Diagnosis not present

## 2021-11-12 DIAGNOSIS — B078 Other viral warts: Secondary | ICD-10-CM | POA: Diagnosis not present

## 2021-11-12 DIAGNOSIS — Z85828 Personal history of other malignant neoplasm of skin: Secondary | ICD-10-CM | POA: Diagnosis not present

## 2021-12-03 DIAGNOSIS — I700 Atherosclerosis of aorta: Secondary | ICD-10-CM | POA: Diagnosis not present

## 2021-12-03 DIAGNOSIS — E785 Hyperlipidemia, unspecified: Secondary | ICD-10-CM | POA: Diagnosis not present

## 2021-12-03 DIAGNOSIS — Z1231 Encounter for screening mammogram for malignant neoplasm of breast: Secondary | ICD-10-CM | POA: Diagnosis not present

## 2021-12-03 DIAGNOSIS — R7989 Other specified abnormal findings of blood chemistry: Secondary | ICD-10-CM | POA: Diagnosis not present

## 2021-12-03 DIAGNOSIS — M810 Age-related osteoporosis without current pathological fracture: Secondary | ICD-10-CM | POA: Diagnosis not present

## 2021-12-09 DIAGNOSIS — S22000A Wedge compression fracture of unspecified thoracic vertebra, initial encounter for closed fracture: Secondary | ICD-10-CM | POA: Diagnosis not present

## 2021-12-09 DIAGNOSIS — L900 Lichen sclerosus et atrophicus: Secondary | ICD-10-CM | POA: Diagnosis not present

## 2021-12-09 DIAGNOSIS — R109 Unspecified abdominal pain: Secondary | ICD-10-CM | POA: Diagnosis not present

## 2021-12-09 DIAGNOSIS — E785 Hyperlipidemia, unspecified: Secondary | ICD-10-CM | POA: Diagnosis not present

## 2021-12-09 DIAGNOSIS — Z1331 Encounter for screening for depression: Secondary | ICD-10-CM | POA: Diagnosis not present

## 2021-12-09 DIAGNOSIS — Z1339 Encounter for screening examination for other mental health and behavioral disorders: Secondary | ICD-10-CM | POA: Diagnosis not present

## 2021-12-09 DIAGNOSIS — I700 Atherosclerosis of aorta: Secondary | ICD-10-CM | POA: Diagnosis not present

## 2021-12-09 DIAGNOSIS — M810 Age-related osteoporosis without current pathological fracture: Secondary | ICD-10-CM | POA: Diagnosis not present

## 2021-12-09 DIAGNOSIS — Z0000 Encounter for general adult medical examination without abnormal findings: Secondary | ICD-10-CM | POA: Diagnosis not present

## 2021-12-17 ENCOUNTER — Telehealth: Payer: Self-pay | Admitting: Podiatry

## 2021-12-17 DIAGNOSIS — R922 Inconclusive mammogram: Secondary | ICD-10-CM | POA: Diagnosis not present

## 2021-12-17 DIAGNOSIS — R928 Other abnormal and inconclusive findings on diagnostic imaging of breast: Secondary | ICD-10-CM | POA: Diagnosis not present

## 2021-12-17 DIAGNOSIS — R921 Mammographic calcification found on diagnostic imaging of breast: Secondary | ICD-10-CM | POA: Diagnosis not present

## 2021-12-17 NOTE — Telephone Encounter (Signed)
Spoke with patient today , she will stop in on 12/19/21 to pick up orthotics

## 2021-12-23 ENCOUNTER — Other Ambulatory Visit: Payer: Self-pay | Admitting: Radiology

## 2021-12-23 DIAGNOSIS — D0512 Intraductal carcinoma in situ of left breast: Secondary | ICD-10-CM | POA: Diagnosis not present

## 2021-12-25 ENCOUNTER — Encounter: Payer: Self-pay | Admitting: Internal Medicine

## 2021-12-25 ENCOUNTER — Telehealth: Payer: Self-pay | Admitting: Hematology and Oncology

## 2021-12-25 NOTE — Telephone Encounter (Signed)
Spoke to patient to confirm upcoming morning [facility] clinic appointment on 12/6, paperwork will be sent via [facility].   Gave location and time, also informed patient that the surgeon's office would be calling as well to get information from them similar to the packet that they will be receiving so make sure to do both.  Reminded patient that all providers will be coming to the clinic to see them HERE and if they had any questions to not hesitate to reach back out to myself or their navigators.

## 2022-01-03 ENCOUNTER — Encounter: Payer: Self-pay | Admitting: *Deleted

## 2022-01-03 DIAGNOSIS — D0512 Intraductal carcinoma in situ of left breast: Secondary | ICD-10-CM | POA: Insufficient documentation

## 2022-01-06 ENCOUNTER — Encounter: Payer: Self-pay | Admitting: *Deleted

## 2022-01-06 DIAGNOSIS — D0512 Intraductal carcinoma in situ of left breast: Secondary | ICD-10-CM

## 2022-01-06 NOTE — Progress Notes (Signed)
Radiation Oncology         [phone]) [phone] ________________________________  Name: Melissa Frey        MRN: [id number]  Date of Service: 01/08/2022 DOB: 06-04-1945  CC:Velna Hatchet, MD  Rolm Bookbinder, MD     REFERRING PHYSICIAN: Rolm Bookbinder, MD   DIAGNOSIS: The encounter diagnosis was Ductal carcinoma in situ (DCIS) of left breast.   HISTORY OF PRESENT ILLNESS: Melissa Frey is a 76 y.o. female seen in the multidisciplinary breast clinic for a new diagnosis of left breast cancer. The patient was noted to have screening detected calcifications in the left breast.  Further diagnostic workup showed amorphous group of calcifications in the left upper inner quadrant with a possible asymmetry.  The patient underwent ultrasound but this did not have a sonographic correlate in the breast and no abnormalities were seen in the left axilla.  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  Her cancer was ER/PR positive.  She is seen to discuss treatment recommendations for her disease.    PREVIOUS RADIATION THERAPY: {EXAM; YES/NO:19492::"No"}   PAST MEDICAL HISTORY:  Past Medical History:  Diagnosis Date   Cataract    right cateract removed   Diverticulosis    Dysphagia    Gastritis    GERD (gastroesophageal reflux disease)    History of colon polyps    Hyperlipidemia    Internal hemorrhoids    Osteoporosis        PAST SURGICAL HISTORY: Past Surgical History:  Procedure Laterality Date   BELPHAROPTOSIS REPAIR     CATARACT EXTRACTION  04/2013   CESAREAN SECTION  1977/1983   x3   FOOT SURGERY Right 02/2012   HEMORRHOID BANDING     skin cancer removal     face     FAMILY HISTORY:  Family History  Problem Relation Age of Onset   Heart failure Mother    Breast cancer Mother    Colon cancer Neg Hx    Esophageal cancer Neg Hx    Rectal cancer Neg Hx     Stomach cancer Neg Hx      SOCIAL HISTORY:  reports that she has quit smoking. She has never used smokeless tobacco. She reports current alcohol use of about 7.0 standard drinks of alcohol per week. She reports that she does not use drugs.  The patient is widowed and resides in [place].  She***   ALLERGIES: Covid-19 mrna vacc (moderna)   MEDICATIONS:  Current Outpatient Medications  Medication Sig Dispense Refill   acetaminophen (TYLENOL) 650 MG CR tablet Take 1,300 mg by mouth at bedtime.     alendronate (FOSAMAX) 70 MG tablet Take 70 mg by mouth once a week.     BIOTIN PO Take 1 tablet by mouth daily.     Calcium Carbonate-Vitamin D (CALCIUM-D PO) Take 1 tablet by mouth daily.     Coenzyme Q10 (COQ10 PO) Take 1 capsule by mouth daily.     conjugated estrogens (PREMARIN) vaginal cream Place 1 Applicatorful vaginally as needed (dryness).     Multiple Vitamin (MULTIVITAMIN WITH MINERALS) TABS tablet Take 1 tablet by mouth daily.     rosuvastatin (CRESTOR) 10 MG tablet Take 1 tablet by mouth once daily (Patient taking differently: Take 10 mg by mouth at bedtime.) 90 tablet 0   No current facility-administered medications for this visit.     REVIEW OF SYSTEMS: On review of systems, the patient reports that  she is doing ***     PHYSICAL EXAM:  Wt Readings from Last 3 Encounters:  04/12/20 124 lb (56.2 kg)  02/21/20 124 lb (56.2 kg)  01/24/20 121 lb (54.9 kg)   Temp Readings from Last 3 Encounters:  04/12/20 97.8 F (36.6 C) (Skin)  02/26/20 98.7 F (37.1 C) (Oral)  08/25/17 98.4 F (36.9 C)   BP Readings from Last 3 Encounters:  04/12/20 135/88  02/26/20 125/69  02/21/20 130/68   Pulse Readings from Last 3 Encounters:  04/12/20 (!) 52  02/26/20 74  02/21/20 79    In general this is a well appearing *** female in no acute distress. She's alert and oriented x4 and appropriate throughout the examination. Cardiopulmonary assessment is negative for acute distress and  she exhibits normal effort. Bilateral breast exam is deferred.    ECOG = ***  0 - Asymptomatic (Fully active, able to carry on all predisease activities without restriction)  1 - Symptomatic but completely ambulatory (Restricted in physically strenuous activity but ambulatory and able to carry out work of a light or sedentary nature. For example, light housework, office work)  2 - Symptomatic, <50% in bed during the day (Ambulatory and capable of all self care but unable to carry out any work activities. Up and about more than 50% of waking hours)  3 - Symptomatic, >50% in bed, but not bedbound (Capable of only limited self-care, confined to bed or chair 50% or more of waking hours)  4 - Bedbound (Completely disabled. Cannot carry on any self-care. Totally confined to bed or chair)  5 - Death   Eustace Pen MM, Creech RH, Tormey DC, et al. [phone]). "Toxicity and response criteria of the [facility]". [place] Oncol. 5 (6): 649-55    LABORATORY DATA:  Lab Results  Component Value Date   WBC 4.6 02/26/2020   HGB 12.9 02/26/2020   HCT 40.6 02/26/2020   MCV 93.5 02/26/2020   PLT 198 02/26/2020   Lab Results  Component Value Date   NA 141 02/26/2020   K 3.9 02/26/2020   CL 104 02/26/2020   CO2 28 02/26/2020   Lab Results  Component Value Date   ALT 12 01/24/2020   AST 20 01/24/2020   ALKPHOS 59 01/24/2020   BILITOT 0.3 01/24/2020      RADIOGRAPHY: No results found.     IMPRESSION/PLAN: 1. Intermediate grade, ER/PR positive DCIS of the left breast. Dr. Lisbeth Renshaw discusses the pathology findings and reviews the nature of early-stage left breast disease. The consensus from the breast conference includes breast conservation with lumpectomy. Dr. Lisbeth Renshaw would recommend external radiotherapy to the breast  to reduce risks of local recurrence.  Dr. Lindi Adie recommends that her course in the followed with antiestrogen therapy. We discussed the risks, benefits, short,  and long term effects of radiotherapy, as well as the curative intent, and the patient is interested in proceeding. Dr. Lisbeth Renshaw discusses the delivery and logistics of radiotherapy and anticipates a course of 4 weeks of radiotherapy to the left breast with deep inspiration breath-hold technique. We will see her back a few weeks after surgery to discuss the simulation process and anticipate we starting radiotherapy about 4-6 weeks after surgery.  2. Possible genetic predisposition to malignancy. The patient is a candidate for genetic testing given her personal and family history. She will meet with our geneticist today in clinic.   In a visit lasting *** minutes, greater than 50% of the time was spent  face to face reviewing her case, as well as in preparation of, discussing, and coordinating the patient's care.  The above documentation reflects my direct findings during this shared patient visit. Please see the separate note by Dr. Lisbeth Renshaw on this date for the remainder of the patient's plan of care.    Carola Rhine, [facility]    **Disclaimer: This note was dictated with voice recognition software. Similar sounding words can inadvertently be transcribed and this note may contain transcription errors which may not have been corrected upon publication of note.**

## 2022-01-08 ENCOUNTER — Other Ambulatory Visit: Payer: Self-pay | Admitting: General Surgery

## 2022-01-08 ENCOUNTER — Inpatient Hospital Stay: Payer: PPO | Admitting: Licensed Clinical Social Worker

## 2022-01-08 ENCOUNTER — Inpatient Hospital Stay: Payer: PPO | Attending: Hematology and Oncology

## 2022-01-08 ENCOUNTER — Ambulatory Visit
Admission: RE | Admit: 2022-01-08 | Discharge: 2022-01-08 | Disposition: A | Discharge status: Home / Self Care | Payer: PPO | Source: Ambulatory Visit | Attending: Radiation Oncology | Admitting: Radiation Oncology

## 2022-01-08 ENCOUNTER — Ambulatory Visit: Payer: PPO

## 2022-01-08 ENCOUNTER — Other Ambulatory Visit: Payer: Self-pay | Admitting: *Deleted

## 2022-01-08 ENCOUNTER — Encounter: Payer: Self-pay | Admitting: Hematology and Oncology

## 2022-01-08 ENCOUNTER — Encounter: Payer: Self-pay | Admitting: *Deleted

## 2022-01-08 ENCOUNTER — Inpatient Hospital Stay (HOSPITAL_BASED_OUTPATIENT_CLINIC_OR_DEPARTMENT_OTHER): Payer: PPO | Admitting: Hematology and Oncology

## 2022-01-08 ENCOUNTER — Inpatient Hospital Stay (HOSPITAL_BASED_OUTPATIENT_CLINIC_OR_DEPARTMENT_OTHER): Payer: PPO | Admitting: Genetic Counselor

## 2022-01-08 VITALS — BP 140/63 | HR 90 | Temp 97.2°F | Resp 18 | Ht 60.75 in | Wt 122.8 lb

## 2022-01-08 DIAGNOSIS — Z170 Estrogen receptor positive status [ER+]: Secondary | ICD-10-CM | POA: Diagnosis not present

## 2022-01-08 DIAGNOSIS — D0512 Intraductal carcinoma in situ of left breast: Secondary | ICD-10-CM

## 2022-01-08 DIAGNOSIS — Z87891 Personal history of nicotine dependence: Secondary | ICD-10-CM | POA: Insufficient documentation

## 2022-01-08 DIAGNOSIS — Z803 Family history of malignant neoplasm of breast: Secondary | ICD-10-CM

## 2022-01-08 LAB — CBC WITH DIFFERENTIAL (CANCER CENTER ONLY)
Abs Immature Granulocytes: 0.01 10*3/uL (ref 0.00–0.07)
Basophils Absolute: 0.1 10*3/uL (ref 0.0–0.1)
Basophils Relative: 1 %
Eosinophils Absolute: 0.1 10*3/uL (ref 0.0–0.5)
Eosinophils Relative: 2 %
HCT: 40.3 % (ref 36.0–46.0)
Hemoglobin: 13.3 g/dL (ref 12.0–15.0)
Immature Granulocytes: 0 %
Lymphocytes Relative: 38 %
Lymphs Abs: 1.8 10*3/uL (ref 0.7–4.0)
MCH: 30.9 pg (ref 26.0–34.0)
MCHC: 33 g/dL (ref 30.0–36.0)
MCV: 93.5 fL (ref 80.0–100.0)
Monocytes Absolute: 0.3 10*3/uL (ref 0.1–1.0)
Monocytes Relative: 7 %
Neutro Abs: 2.4 10*3/uL (ref 1.7–7.7)
Neutrophils Relative %: 52 %
Platelet Count: 193 10*3/uL (ref 150–400)
RBC: 4.31 MIL/uL (ref 3.87–5.11)
RDW: 12.7 % (ref 11.5–15.5)
WBC Count: 4.7 10*3/uL (ref 4.0–10.5)
nRBC: 0 % (ref 0.0–0.2)

## 2022-01-08 LAB — GENETIC SCREENING ORDER

## 2022-01-08 LAB — CMP (CANCER CENTER ONLY)
ALT: 13 U/L (ref 0–44)
AST: 21 U/L (ref 15–41)
Albumin: 4.5 g/dL (ref 3.5–5.0)
Alkaline Phosphatase: 49 U/L (ref 38–126)
Anion gap: 4 — ABNORMAL LOW (ref 5–15)
BUN: 14 mg/dL (ref 8–23)
CO2: 32 mmol/L (ref 22–32)
Calcium: 9.9 mg/dL (ref 8.9–10.3)
Chloride: 104 mmol/L (ref 98–111)
Creatinine: 0.85 mg/dL (ref 0.44–1.00)
GFR, Estimated: >60 mL/min (ref >60)
Glucose, Bld: 88 mg/dL (ref 70–99)
Potassium: 4.2 mmol/L (ref 3.5–5.1)
Sodium: 140 mmol/L (ref 135–145)
Total Bilirubin: 0.5 mg/dL (ref 0.3–1.2)
Total Protein: 6.9 g/dL (ref 6.5–8.1)

## 2022-01-08 NOTE — Assessment & Plan Note (Signed)
12/23/2021:Screening detected left breast calcifications upper medial quadrant 0.8 cm, axilla negative, biopsy: Intermediate grade DCIS ER 95%, PR 50%  Pathology review: I discussed with the patient the difference between DCIS and invasive breast cancer. It is considered a precancerous lesion. DCIS is classified as a 0. It is generally detected through mammograms as calcifications. We discussed the significance of grades and its impact on prognosis. We also discussed the importance of ER and PR receptors and their implications to adjuvant treatment options. Prognosis of DCIS dependence on grade, comedo necrosis. It is anticipated that if not treated, 20-30% of DCIS can develop into invasive breast cancer.  Recommendation: 1. Breast conserving surgery 2. Followed by adjuvant radiation therapy 3. Followed by antiestrogen therapy with tamoxifen 5 years  Tamoxifen counseling: We discussed the risks and benefits of tamoxifen. These include but not limited to insomnia, hot flashes, mood changes, vaginal dryness, and weight gain. Although rare, serious side effects including endometrial cancer, risk of blood clots were also discussed. We strongly believe that the benefits far outweigh the risks. Patient understands these risks and consented to starting treatment. Planned treatment duration is 5 years.  Return to clinic after surgery to discuss the final pathology report and come up with an adjuvant treatment plan.

## 2022-01-08 NOTE — Progress Notes (Signed)
[place] NOTE  Patient Care Team: Velna Hatchet, MD as PCP - General (Internal Medicine) Gildardo Cranker, MD as Orthopedic Technician (Orthopedic Surgery) Geoffery Lyons, NP as Nurse Practitioner (Family Medicine) Hennie Duos, MD as Consulting Physician (Rheumatology) Mauro Kaufmann, RN as Oncology Nurse Navigator Rockwell Germany, RN as Oncology Nurse Navigator Nicholas Lose, MD as Consulting Physician (Hematology and Oncology) Kyung Rudd, MD as Consulting Physician (Radiation Oncology) Rolm Bookbinder, MD as Consulting Physician (General Surgery)  CHIEF COMPLAINTS/PURPOSE OF CONSULTATION:  Newly diagnosed breast cancer  HISTORY OF PRESENTING ILLNESS:  Melissa Frey 76 y.o. female is here because of recent diagnosis of left breast DCIS.  Patient had screening mammogram that detected calcifications left breast 0.8 cm.  Biopsy revealed intermediate grade DCIS that was ER/PR positive.  She was presented this morning in the multidisciplinary tumor board and she is here today by herself to discuss her treatment plan.  I reviewed her records extensively and collaborated the history with the patient.  SUMMARY OF ONCOLOGIC HISTORY: Oncology History  Ductal carcinoma in situ (DCIS) of left breast  12/23/2021 Initial Diagnosis   Screening detected left breast calcifications upper medial quadrant 0.8 cm, axilla negative, biopsy: Intermediate grade DCIS ER 95%, PR 50%   01/08/2022 Cancer Staging   Staging form: Breast, AJCC 8th Edition - Clinical: Stage 0 (cTis (DCIS), cN0, cM0, G2, ER+, PR+, HER2: Not Assessed) - Signed by Nicholas Lose, MD on 01/08/2022 Stage prefix: Initial diagnosis Histologic grading system: 3 grade system      MEDICAL HISTORY:  Past Medical History:  Diagnosis Date   Breast cancer ([place])    Cataract    right cateract removed   Diverticulosis    Dysphagia    Gastritis    GERD (gastroesophageal reflux disease)    History  of colon polyps    Hyperlipidemia    Internal hemorrhoids    Osteoporosis     SURGICAL HISTORY: Past Surgical History:  Procedure Laterality Date   BELPHAROPTOSIS REPAIR     CATARACT EXTRACTION  04/2013   CESAREAN SECTION  1977/1983   x3   FOOT SURGERY Right 02/2012   HEMORRHOID BANDING     skin cancer removal     face    SOCIAL HISTORY: Social History   Socioeconomic History   Marital status: Widowed    Spouse name: Not on file   Number of children: Not on file   Years of education: Not on file   Highest education level: Not on file  Occupational History   Not on file  Tobacco Use   Smoking status: Former   Smokeless tobacco: Never  Vaping Use   Vaping Use: Never used  Substance and Sexual Activity   Alcohol use: Yes    Alcohol/week: 7.0 standard drinks of alcohol    Types: 7 Glasses of wine per week    Comment: 5-7 glasses red wine per week per patient.   Drug use: No   Sexual activity: Yes  Other Topics Concern   Not on file  Social History Narrative   Not on file   Social Determinants of Health   Financial Resource Strain: Not on file  Food Insecurity: Not on file  Transportation Needs: Not on file  Physical Activity: Not on file  Stress: Not on file  Social Connections: Not on file  Intimate Partner Violence: Not on file    FAMILY HISTORY: Family History  Problem Relation Age of Onset   Heart failure  Mother    Breast cancer Mother    Colon cancer Neg Hx    Esophageal cancer Neg Hx    Rectal cancer Neg Hx    Stomach cancer Neg Hx     ALLERGIES:  is allergic to covid-19 mrna vacc (moderna).  MEDICATIONS:  Current Outpatient Medications  Medication Sig Dispense Refill   acetaminophen (TYLENOL) 650 MG CR tablet Take 1,300 mg by mouth at bedtime.     alendronate (FOSAMAX) 70 MG tablet Take 70 mg by mouth once a week.     BIOTIN PO Take 1 tablet by mouth daily.     Calcium Carbonate-Vitamin D (CALCIUM-D PO) Take 1 tablet by mouth daily.      Coenzyme Q10 (COQ10 PO) Take 1 capsule by mouth daily.     conjugated estrogens (PREMARIN) vaginal cream Place 1 Applicatorful vaginally as needed (dryness).     Multiple Vitamin (MULTIVITAMIN WITH MINERALS) TABS tablet Take 1 tablet by mouth daily.     rosuvastatin (CRESTOR) 10 MG tablet Take 1 tablet by mouth once daily (Patient taking differently: Take 10 mg by mouth at bedtime.) 90 tablet 0   No current facility-administered medications for this visit.    REVIEW OF SYSTEMS:   Constitutional: Denies fevers, chills or abnormal night sweats Breast:  Denies any palpable lumps or discharge All other systems were reviewed with the patient and are negative.  PHYSICAL EXAMINATION: ECOG PERFORMANCE STATUS: 0 - Asymptomatic Rotator cuff tendinitis  Vitals:   01/08/22 0853  BP: (!) 140/63  Pulse: 90  Resp: 18  Temp: (!) 97.2 F (36.2 C)  SpO2: 98%   Filed Weights   01/08/22 0853  Weight: 122 lb 12.8 oz (55.7 kg)    GENERAL:alert, no distress and comfortable    LABORATORY DATA:  I have reviewed the data as listed Lab Results  Component Value Date   WBC 4.7 01/08/2022   HGB 13.3 01/08/2022   HCT 40.3 01/08/2022   MCV 93.5 01/08/2022   PLT 193 01/08/2022   Lab Results  Component Value Date   NA 140 01/08/2022   K 4.2 01/08/2022   CL 104 01/08/2022   CO2 32 01/08/2022    RADIOGRAPHIC STUDIES: I have personally reviewed the radiological reports and agreed with the findings in the report.  ASSESSMENT AND PLAN:  Ductal carcinoma in situ (DCIS) of left breast 12/23/2021:Screening detected left breast calcifications upper medial quadrant 0.8 cm, axilla negative, biopsy: Intermediate grade DCIS ER 95%, PR 50%  Pathology review: I discussed with the patient the difference between DCIS and invasive breast cancer. It is considered a precancerous lesion. DCIS is classified as a 0. It is generally detected through mammograms as calcifications. We discussed the significance of  grades and its impact on prognosis. We also discussed the importance of ER and PR receptors and their implications to adjuvant treatment options. Prognosis of DCIS dependence on grade, comedo necrosis. It is anticipated that if not treated, 20-30% of DCIS can develop into invasive breast cancer.  Recommendation: 1. Breast conserving surgery 2. Followed by adjuvant radiation therapy 3. Followed by antiestrogen therapy with tamoxifen 5 years  Tamoxifen counseling: We discussed the risks and benefits of tamoxifen. These include but not limited to insomnia, hot flashes, mood changes, vaginal dryness, and weight gain. Although rare, serious side effects including endometrial cancer, risk of blood clots were also discussed. We strongly believe that the benefits far outweigh the risks. Patient understands these risks and consented to starting treatment. Planned treatment duration  is 5 years.  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 She has a left rotator cuff injury.  [place] and most recently had been to [place].  Return to clinic after surgery to discuss the final pathology report and come up with an adjuvant treatment plan.   All questions were answered. The patient knows to call the clinic with any problems, questions or concerns.    Harriette Ohara, MD 01/08/22

## 2022-01-08 NOTE — Progress Notes (Signed)
[place] Work  Initial Assessment   Melissa Frey is a 76 y.o. year old female presenting alone. Clinical Social Work was referred by  [facility]  for assessment of psychosocial needs.   SDOH (Social Determinants of Health) assessments performed: Yes SDOH Interventions    Flowsheet Row Clinical Support from 01/08/2022 in [facility]  SDOH Interventions   Food Insecurity Interventions Intervention Not Indicated  Housing Interventions Intervention Not Indicated  Transportation Interventions Intervention Not Indicated  Utilities Interventions Intervention Not Indicated  Financial Strain Interventions Intervention Not Indicated       SDOH Screenings   Food Insecurity: No Food Insecurity (01/08/2022)  Housing: Low Risk  (01/08/2022)  Transportation Needs: No Transportation Needs (01/08/2022)  Utilities: Not At Risk (01/08/2022)  Financial Resource Strain: Low Risk  (01/08/2022)  Tobacco Use: Medium Risk (01/08/2022)     Distress Screen completed: Yes    01/08/2022   12:01 PM  ONCBCN DISTRESS SCREENING  Screening Type Initial Screening  Distress experienced in past week (1-10) 6  Physical Problem type Pain      Family/Social Information:  Housing Arrangement: patient lives alone. Has a boyfriend who visits but lives in [place]. Family members/support persons in your life? Family and Friends Transportation concerns: no  Employment: Retired. [employer], and travels .  Income source: Retirement income & savings Financial concerns: No Type of concern: None Food access concerns: no Religious or spiritual practice: Not known Services Currently in place:    Coping/ Adjustment to diagnosis: Patient understands treatment plan and what happens next? Yes. She has no concerns with cancer dx and treatment plan. Concerns about diagnosis and/or treatment: I'm not especially worried about anything Patient reported stressors:  Rotator cuff  issue Hopes and/or priorities: complete treatment and return to her very active life Patient enjoys being outside, exercise, time with family/ friends, and travel Current coping skills/ strengths: Ability for insight , Active sense of humor , Capable of independent living , Communication skills , Special hobby/interest , and Supportive family/friends     SUMMARY: Current SDOH Barriers:  None noted today  Clinical Social Work Clinical Goal(s):  No clinical social work goals at this time  Interventions: Discussed common feeling and emotions when being diagnosed with cancer, and the importance of support during treatment Informed patient of the support team roles and support services at [facility] Provided [place] contact information and encouraged patient to call with any questions or concerns   Follow Up Plan: Patient will contact CSW with any support or resource needs Patient verbalizes understanding of plan: Yes    Melissa Frey E Finbar Nippert, LCSW

## 2022-01-08 NOTE — Research (Unsigned)
MTG-015 - Tissue and Bodily Fluids: Translational Medicine: Discovery and Evaluation of Biomarkers/Pharmacogenomics for the Diagnosis and Personalized Management of Patients     This Coordinator has reviewed this patient's inclusion and exclusion criteria and confirmed Melissa Frey is eligible for study participation.  Patient will continue with enrollment.  Menopausal status (women only): Melissa Frey is post menopausal with LMP unknown.  Eligibility confirmed by treating investigator, who also agrees that patient should proceed with enrollment.   Patient Melissa Frey was identified by Dr. Lindi Adie as a potential candidate for the above listed study.  This Clinical Research Coordinator met with Melissa Frey, Melissa Frey, on 01/08/22 in a manner and location that ensures patient privacy to discuss participation in the above listed research study.  Patient is Unaccompanied.  A copy of the informed consent document and separate HIPAA Authorization was provided to the patient.  Patient reads, speaks, and understands [place].    Patient was provided with the business card of this Coordinator and encouraged to contact the research team with any questions.  Patient was provided the option of taking informed consent documents home to review and was encouraged to review at their convenience with their support network, including other care providers. Patient is comfortable with making a decision regarding study participation today.  As outlined in the informed consent form, this Coordinator and Melissa Frey discussed the purpose of the research study, the investigational nature of the study, study procedures and requirements for study participation, potential risks and benefits of study participation, as well as alternatives to participation. This study is not blinded. The patient understands participation is voluntary and they may withdraw from study participation at any time.  This  study does not involve randomization.  This study does not involve an investigational drug or device. This study does not involve a placebo. Patient understands enrollment is pending full eligibility review.   Confidentiality and how the patient's information will be used as part of study participation were discussed.  Patient was informed there is not reimbursement provided for their time and effort spent on trial participation.  The patient is encouraged to discuss research study participation with their insurance provider to determine what costs they may incur as part of study participation, including research related injury.    All questions were answered to patient's satisfaction.  The informed consent and separate HIPAA Authorization was reviewed page by page.  The patient's mental and emotional status is appropriate to provide informed consent, and the patient verbalizes an understanding of study participation.  Patient has agreed to participate in the above listed research study and has voluntarily signed the informed consent IR Approved Nov 21,2022 and separate HIPAA Authorization, version IRB Approved Nov 21,2022  on 01/08/22 at 11:00AM.  The patient was provided with a copy of the signed informed consent form and separate HIPAA Authorization for their reference.  No study specific procedures were obtained prior to the signing of the informed consent document.  Approximately 30 minutes were spent with the patient reviewing the informed consent documents. Release of Information form was not offered to the patient.    The patient did not have enough time to complete the blood collection portion of the study, and the patient will be followed up with on 01/09/2022 to schedule and complete blood sample collection.    [place], [place] 01/08/2022 1:25 PM

## 2022-01-08 NOTE — Research (Signed)
MTG-015 - Tissue and Bodily Fluids: Translational Medicine: Discovery and Evaluation of Biomarkers/Pharmacogenomics for the Diagnosis and Personalized Management of Patients     This Nurse has reviewed this patient's inclusion and exclusion criteria as a second review and confirms Melissa Frey is eligible for study participation.  Patient may continue with enrollment.   Brion Aliment RN, BSN, CCRP Clinical Research Nurse Lead 01/08/2022 11:41 AM

## 2022-01-08 NOTE — Progress Notes (Signed)
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 Family history of breast cancer      HISTORY OF PRESENT ILLNESS:   Melissa Frey, a 76 y.o. female, was seen for a [place] cancer genetics consultation at the request of Dr. Lindi Adie due to a personal and family history of breast cancer.  Ms. Llera presents to clinic today to discuss the possibility of a hereditary predisposition to cancer, to discuss genetic testing, and to further clarify her future cancer risks, as well as potential cancer risks for family members.   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 The treatment plan is pending.    CANCER HISTORY:  Oncology History  Ductal carcinoma in situ (DCIS) of left breast  12/23/2021 Initial Diagnosis   Screening detected left breast calcifications upper medial quadrant 0.8 cm, axilla negative, biopsy: Intermediate grade DCIS ER 95%, PR 50%   01/08/2022 Cancer Staging   Staging form: Breast, AJCC 8th Edition - Clinical: Stage 0 (cTis (DCIS), cN0, cM0, G2, ER+, PR+, HER2: Not Assessed) - Signed by Nicholas Lose, MD on 01/08/2022 Stage prefix: Initial diagnosis Histologic grading system: 3 grade system      Past Medical History:  Diagnosis Date   Breast cancer ([place])    Cataract    right cateract removed   Diverticulosis    Dysphagia    Gastritis    GERD (gastroesophageal reflux disease)    History of colon polyps    Hyperlipidemia    Internal hemorrhoids    Osteoporosis     Past Surgical History:  Procedure Laterality Date   BELPHAROPTOSIS REPAIR     CATARACT EXTRACTION  04/2013   CESAREAN SECTION  1977/1983   x3   FOOT SURGERY Right 02/2012   HEMORRHOID BANDING     skin cancer removal     face    FAMILY HISTORY:  We  obtained a detailed, 4-generation family history.  Significant diagnoses are listed below: Family History  Problem Relation Age of Onset   Breast cancer Mother 2   Breast cancer Cousin 35       maternal female cousin    Melissa Frey does not have information about her paternal family history.   Melissa Frey is unaware of previous family history of genetic testing for hereditary cancer risks. There is no reported Ashkenazi Jewish ancestry.   GENETIC COUNSELING ASSESSMENT: [name] is a 76 y.o. female with a personal and family history which is somewhat suggestive of a hereditary cancer syndrome and predisposition to cancer given the presence of breast cancer in multiple generations of her family. We, therefore, discussed and recommended the following at today's visit.   DISCUSSION: We discussed that 5 - 10% of cancer is hereditary.  Most cases of hereditary breast cancer are associated with mutations in BRCA1/2.  There are other genes that can be associated with hereditary breast cancer syndromes.  We discussed that testing is beneficial for several reasons including knowing how to follow individuals for their cancer risks and understanding if other family members could be at risk for cancer and allowing them to undergo genetic testing.   We reviewed the characteristics, features and inheritance patterns of hereditary cancer syndromes. We also discussed genetic testing, including the appropriate  family members to test, the process of testing, insurance coverage and turn-around-time for results. We discussed the implications of a negative, positive, carrier and/or variant of uncertain significant result. We recommended Melissa Frey pursue genetic testing for a panel that includes genes associated with breast cancer and other cancers.    The Common Hereditary Cancers + RNA Panel offered by [facility] includes sequencing, deletion/duplication, and RNA testing of the following 48 genes: APC, ATM, AXIN2, BARD1,  BMPR1A, BRCA1, BRCA2, BRIP1, CDH1, CDK4*, CDKN2A (p14ARF)*, CDKN2A (p16INK4a)*, CHEK2, CTNNA1, DICER1, EPCAM (Deletion/duplication testing only), GREM1 (promoter region deletion/duplication testing only), KIT, MBD4, MEN1, MLH1, MSH2, MSH3, MSH6, MUTYH, NBN, NF1, NHTL1, PALB2, PDGFRA*, PMS2, POLD1, POLE, PTEN, RAD50, RAD51C, RAD51D, SDHB, SDHC, SDHD, SMAD4, SMARCA4. STK11, TP53, TSC1, TSC2, and VHL.  The following genes were evaluated for sequence changes only: SDHA and HOXB13 c.251G>A variant only.  RNA analysis is not performed for the * genes.     Based on [name]'s personal and family history of breast cancer, she meets medical criteria for genetic testing. Despite that she meets criteria, she may still have an out of pocket cost. We discussed that if her out of pocket cost for testing is over $100, the laboratory should contact her and discuss the self-pay prices and/or patient pay assistance programs.    PLAN: After considering the risks, benefits, and limitations, Melissa Frey provided informed consent to pursue genetic testing and the blood sample was sent to [facility] for analysis of the Common Hereditary Cancers +RNA Panel. Results should be available within approximately 3 weeks' time, at which point they will be disclosed by telephone to Ms. Oloughlin, as will any additional recommendations warranted by these results. Melissa Frey will receive a summary of her genetic counseling visit and a copy of her results once available. This information will also be available in Epic.   Melissa Frey's questions were answered to her satisfaction today. Our contact information was provided should additional questions or concerns arise. Thank you for the referral and allowing [place] to share in the care of your patient.   Drina Jobst M. Joette Catching, [place], [facility] Genetic Counselor [email or web address] (P) [phone]  The patient was seen for a total of 15 minutes in face-to-face genetic counseling.  The patient was  seen alone.  Dr. Lindi Adie was available to discuss this case as needed.    _______________________________________________________________________ For Office Staff:  Number of people involved in session: 1 Was an Intern/ student involved with case: no

## 2022-01-09 ENCOUNTER — Telehealth: Payer: Self-pay

## 2022-01-09 DIAGNOSIS — M25512 Pain in left shoulder: Secondary | ICD-10-CM | POA: Diagnosis not present

## 2022-01-09 DIAGNOSIS — C50912 Malignant neoplasm of unspecified site of left female breast: Secondary | ICD-10-CM | POA: Diagnosis not present

## 2022-01-09 DIAGNOSIS — S46002A Unspecified injury of muscle(s) and tendon(s) of the rotator cuff of left shoulder, initial encounter: Secondary | ICD-10-CM | POA: Diagnosis not present

## 2022-01-09 NOTE — Telephone Encounter (Signed)
MTG-015 - Tissue and Bodily Fluids: Translational Medicine: Discovery and Evaluation of Biomarkers/Pharmacogenomics for the Diagnosis and Personalized Management of Patients    Called patient to confirm participation in the above mentioned study and schedule sample collection date. Patient will arrive on 01/16/2022 at 11:00am to complete study procedures.   Harvis Mabus, [facility] 01/09/2022 10:50 AM

## 2022-01-13 ENCOUNTER — Encounter (HOSPITAL_BASED_OUTPATIENT_CLINIC_OR_DEPARTMENT_OTHER): Payer: Self-pay | Admitting: General Surgery

## 2022-01-13 ENCOUNTER — Other Ambulatory Visit: Payer: Self-pay

## 2022-01-13 ENCOUNTER — Other Ambulatory Visit: Payer: Self-pay | Admitting: *Deleted

## 2022-01-13 DIAGNOSIS — D0512 Intraductal carcinoma in situ of left breast: Secondary | ICD-10-CM

## 2022-01-14 MED ORDER — ENSURE PRE-SURGERY PO LIQD: 296.0000 mL | Freq: Once | ORAL | Status: DC

## 2022-01-14 NOTE — Progress Notes (Signed)

## 2022-01-15 ENCOUNTER — Telehealth: Payer: Self-pay | Admitting: Genetic Counselor

## 2022-01-15 ENCOUNTER — Ambulatory Visit: Payer: Self-pay | Admitting: Genetic Counselor

## 2022-01-15 ENCOUNTER — Encounter: Payer: Self-pay | Admitting: Genetic Counselor

## 2022-01-15 DIAGNOSIS — D0512 Intraductal carcinoma in situ of left breast: Secondary | ICD-10-CM

## 2022-01-15 DIAGNOSIS — Z1379 Encounter for other screening for genetic and chromosomal anomalies: Secondary | ICD-10-CM | POA: Insufficient documentation

## 2022-01-15 DIAGNOSIS — Z803 Family history of malignant neoplasm of breast: Secondary | ICD-10-CM

## 2022-01-15 NOTE — Telephone Encounter (Signed)
Revealed negative genetics.   

## 2022-01-15 NOTE — Progress Notes (Signed)
HPI:   Ms. Wofford was previously seen in the [place] clinic due to a personal and family history of breast cancer and concerns regarding a hereditary predisposition to cancer. Please refer to our prior cancer genetics clinic note for more information regarding our discussion, assessment and recommendations, at the time. Ms. Wargo's recent genetic test results were disclosed to her, as were recommendations warranted by these results. These results and recommendations are discussed in more detail below.  CANCER HISTORY:  Oncology History  Ductal carcinoma in situ (DCIS) of left breast  12/23/2021 Initial Diagnosis   Screening detected left breast calcifications upper medial quadrant 0.8 cm, axilla negative, biopsy: Intermediate grade DCIS ER 95%, PR 50%   01/08/2022 Cancer Staging   Staging form: Breast, AJCC 8th Edition - Clinical: Stage 0 (cTis (DCIS), cN0, cM0, G2, ER+, PR+, HER2: Not Assessed) - Signed by Nicholas Lose, MD on 01/08/2022 Stage prefix: Initial diagnosis Histologic grading system: 3 grade system   01/15/2022 Genetic Testing   Negative hereditary cancer genetic testing: no pathogenic variants detected in [facility] Common Hereditary Cancers +RNA Panel.  Report date is 01/15/2022.    The [facility] Common Hereditary Cancers + RNA Panel includes sequencing, deletion/duplication, and RNA analysis of the following 48 genes: APC, ATM, AXIN2, BAP1, BARD1, BMPR1A, BRCA1, BRCA2, BRIP1, CDH1, CDK4*, CDKN2A*, CHEK2, CTNNA1, DICER1, EPCAM* (del/dup only), FH, GREM1* (promoter dup analysis only), HOXB13*, KIT*, MBD4*, MEN1, MLH1, MSH2, MSH3, MSH6, MUTYH, NF1, NTHL1, PALB2, PDGFRA*, PMS2, POLD1, POLE, PTEN, RAD51C, RAD51D, SDHA (sequencing only), SDHB, SDHC, SDHD, SMAD4, SMARCA4, STK11, TP53, TSC1, TSC2, VHL.  *Genes without RNA analysis.      FAMILY HISTORY:  We obtained a detailed, 4-generation family history.  Significant diagnoses are listed below: Family History  Problem  Relation Age of Onset   Breast cancer Mother 71   Breast cancer Cousin 42       maternal female cousin     Ms. Royer does not have information about her paternal family history.     Ms. Bartles is unaware of previous family history of genetic testing for hereditary cancer risks. There is no reported Ashkenazi Jewish ancestry.     GENETIC TEST RESULTS:  The [facility] Common Heredtiary Cancers +RNA Panel found no pathogenic mutations.    The [facility] Common Hereditary Cancers + RNA Panel includes sequencing, deletion/duplication, and RNA analysis of the following 48 genes: APC, ATM, AXIN2, BAP1, BARD1, BMPR1A, BRCA1, BRCA2, BRIP1, CDH1, CDK4*, CDKN2A*, CHEK2, CTNNA1, DICER1, EPCAM* (del/dup only), FH, GREM1* (promoter dup analysis only), HOXB13*, KIT*, MBD4*, MEN1, MLH1, MSH2, MSH3, MSH6, MUTYH, NF1, NTHL1, PALB2, PDGFRA*, PMS2, POLD1, POLE, PTEN, RAD51C, RAD51D, SDHA (sequencing only), SDHB, SDHC, SDHD, SMAD4, SMARCA4, STK11, TP53, TSC1, TSC2, VHL.  *Genes without RNA analysis.  .   The test report has been scanned into EPIC and is located under the Molecular Pathology section of the Results Review tab.  A portion of the result report is included below for reference. Genetic testing reported out on January 15, 2022.       Even though a pathogenic variant was not identified, possible explanations for the cancer in the family may include: There may be no hereditary risk for cancer in the family. The cancers in Ms. Waide and/or her family may be sporadic/familial or due to other genetic and environmental factors. There may be a gene mutation in one of these genes that current testing methods cannot detect but that chance is small. There could be another gene that has  not yet been discovered, or that we have not yet tested, that is responsible for the cancer diagnoses in the family.  It is also possible there is a hereditary cause for the cancer in the family that Ms. Royer did not  inherit.   Therefore, it is important to remain in touch with cancer genetics in the future so that we can continue to offer Ms. Grosch the most up to date genetic testing.    ADDITIONAL GENETIC TESTING:  We discussed with Ms. Riden that her genetic testing was fairly extensive.  If there are additional relevant genes identified to increase cancer risk that can be analyzed in the future, we would be happy to discuss and coordinate this testing at that time.      CANCER SCREENING RECOMMENDATIONS:  Ms. Kabat's test result is considered negative (normal).  This means that we have not identified a hereditary cause for her personal history of DCIS at this time.   An individual's cancer risk and medical management are not determined by genetic test results alone. Overall cancer risk assessment incorporates additional factors, including personal medical history, family history, and any available genetic information that may result in a personalized plan for cancer prevention and surveillance. Therefore, it is recommended she continue to follow the cancer management and screening guidelines provided by her oncology and primary healthcare provider.  RECOMMENDATIONS FOR FAMILY MEMBERS:   Since she did not inherit a identifiable mutation in a cancer predisposition gene included on this panel, her children could not have inherited a known mutation from her in one of these genes. Individuals in this family might be at some increased risk of developing cancer, over the general population risk, due to the family history of cancer.  Individuals in the family should notify their providers of the family history of cancer. We recommend women in this family have a yearly mammogram beginning at age 73, or 73 years younger than the earliest onset of cancer, an annual clinical breast exam, and perform monthly breast self-exams.   Other members of the family may still carry a pathogenic variant in one of these genes that  Ms. Knoble did not inherit. Based on the family history, we recommend her cousin, who was diagnosed with breast cancer at age 53, have genetic counseling and testing. Ms. Depaola can let us know if we can be of any assistance in coordinating genetic counseling and/or testing for this family member.     FOLLOW-UP:  Lastly, we discussed with Ms. Sigg that cancer genetics is a rapidly advancing field and it is possible that new genetic tests will be appropriate for her and/or her family members in the future. We encouraged her to remain in contact with cancer genetics on an annual basis so we can update her personal and family histories and let her know of advances in cancer genetics that may benefit this family.   Our contact number was provided. Ms. Wisenbaker's questions were answered to her satisfaction, and she knows she is welcome to call us at anytime with additional questions or concerns.   Zeynep Fantroy M. Joette Catching, [place], [facility] Genetic Counselor [email or web address] (P) [phone]

## 2022-01-16 ENCOUNTER — Encounter: Payer: Self-pay | Admitting: *Deleted

## 2022-01-16 ENCOUNTER — Telehealth: Payer: Self-pay | Admitting: *Deleted

## 2022-01-16 ENCOUNTER — Inpatient Hospital Stay: Payer: PPO

## 2022-01-16 DIAGNOSIS — D0512 Intraductal carcinoma in situ of left breast: Secondary | ICD-10-CM

## 2022-01-16 LAB — RESEARCH LABS

## 2022-01-16 NOTE — Research (Signed)
MTG-015 - Tissue and Bodily Fluids: Translational Medicine: Discovery and Evaluation of Biomarkers/Pharmacogenomics for the Diagnosis and Personalized Management of Patients   The following social, medical, and family history was collected from the patient on 01/16/22.  Date and time of last meal prior to blood collection: 01/16/2022- 9:00am  Does the patient drink alcohol? Yes Twice a week, 2 glasses of wine.   Does the patient use tobacco products? Former.  Date quit: 1971; Number of years smoked: 4; Packs per day <1.  Menopausal status? postmenopausal  Date of last menstrual cycle? 1995  Does the patient have a personal history of cancer? No   If yes, what type and dates of diagnosis/treatment: N/A  Does the patient have family history of cancer in immediate family (grandparents, parents, and/or siblings)? Yes   If yes, what was the relationship, cancer type, date of diagnosis, and outcome for each? Mother-Breast Cancer   Has the patient received a COVID-19 vaccination? Yes   If yes, vaccine manufacturer? [employer]   Number of doses received? 3  Date of last dose? 12/19/2019  Has the patient ever tested positive for COVID? Yes   If yes, date of last positive COVID test? 09/2020   Date of last COVID test taken? 09/2020  Has the patient received any other vaccines in the past year? No   If yes, which vaccines and dates? N/A  The patient's medication list was reviewed with the patient and it was correct and start dates were verified.  Has the patient stopped any medications in the past 30 days? No     Blood Collection: Research blood obtained by [facility] venipuncture per patient's preference. Patient tolerated well without any adverse events.  Gift Card: $50 gift card given to patient for her participation in this study.    Patient was thanked for their time and participation for the study mention above.  Johny Drilling, [facility] 01/16/2022 1:44 PM

## 2022-01-16 NOTE — Telephone Encounter (Signed)
Spoke to pt concerning [place] from 01/08/22. Denies questions or concerns regarding dx or treatment care plan. Encourage pt to call with needs. Received verbal understanding.

## 2022-01-17 DIAGNOSIS — C50212 Malignant neoplasm of upper-inner quadrant of left female breast: Secondary | ICD-10-CM | POA: Diagnosis not present

## 2022-01-20 ENCOUNTER — Encounter (HOSPITAL_BASED_OUTPATIENT_CLINIC_OR_DEPARTMENT_OTHER): Payer: Self-pay | Admitting: General Surgery

## 2022-01-20 ENCOUNTER — Ambulatory Visit (HOSPITAL_BASED_OUTPATIENT_CLINIC_OR_DEPARTMENT_OTHER)
Admission: RE | Admit: 2022-01-20 | Discharge: 2022-01-20 | Disposition: A | Discharge status: Home / Self Care | Payer: PPO | Attending: General Surgery | Admitting: General Surgery

## 2022-01-20 ENCOUNTER — Ambulatory Visit (HOSPITAL_BASED_OUTPATIENT_CLINIC_OR_DEPARTMENT_OTHER): Payer: PPO | Admitting: Anesthesiology

## 2022-01-20 ENCOUNTER — Encounter (HOSPITAL_BASED_OUTPATIENT_CLINIC_OR_DEPARTMENT_OTHER)
Admission: RE | Disposition: A | Discharge status: Home / Self Care | Payer: Self-pay | Source: Home / Self Care | Attending: General Surgery

## 2022-01-20 ENCOUNTER — Other Ambulatory Visit: Payer: Self-pay

## 2022-01-20 DIAGNOSIS — D0512 Intraductal carcinoma in situ of left breast: Secondary | ICD-10-CM | POA: Insufficient documentation

## 2022-01-20 DIAGNOSIS — K219 Gastro-esophageal reflux disease without esophagitis: Secondary | ICD-10-CM | POA: Insufficient documentation

## 2022-01-20 DIAGNOSIS — Z803 Family history of malignant neoplasm of breast: Secondary | ICD-10-CM | POA: Diagnosis not present

## 2022-01-20 DIAGNOSIS — R1310 Dysphagia, unspecified: Secondary | ICD-10-CM | POA: Insufficient documentation

## 2022-01-20 DIAGNOSIS — Z170 Estrogen receptor positive status [ER+]: Secondary | ICD-10-CM | POA: Diagnosis not present

## 2022-01-20 DIAGNOSIS — C50912 Malignant neoplasm of unspecified site of left female breast: Secondary | ICD-10-CM | POA: Diagnosis not present

## 2022-01-20 DIAGNOSIS — Z87891 Personal history of nicotine dependence: Secondary | ICD-10-CM | POA: Diagnosis not present

## 2022-01-20 DIAGNOSIS — R9230 Dense breasts, unspecified: Secondary | ICD-10-CM | POA: Insufficient documentation

## 2022-01-20 DIAGNOSIS — M25512 Pain in left shoulder: Secondary | ICD-10-CM | POA: Diagnosis not present

## 2022-01-20 HISTORY — PX: BREAST LUMPECTOMY WITH RADIOACTIVE SEED LOCALIZATION: SHX6424

## 2022-01-20 SURGERY — BREAST LUMPECTOMY WITH RADIOACTIVE SEED LOCALIZATION
Anesthesia: General | Site: Breast | Laterality: Left

## 2022-01-20 MED ORDER — CEFAZOLIN SODIUM-DEXTROSE 2-4 GM/100ML-% IV SOLN
2.0000 g | INTRAVENOUS | Status: AC
  Administered 2022-01-20: 2 g via INTRAVENOUS

## 2022-01-20 MED ORDER — CHLORHEXIDINE GLUCONATE CLOTH 2 % EX PADS: 6.0000 | MEDICATED_PAD | Freq: Once | CUTANEOUS | Status: DC

## 2022-01-20 MED ORDER — CEFAZOLIN SODIUM-DEXTROSE 2-4 GM/100ML-% IV SOLN
INTRAVENOUS | Status: AC
  Filled 2022-01-20: qty 100

## 2022-01-20 MED ORDER — ONDANSETRON HCL 4 MG/2ML IJ SOLN
INTRAMUSCULAR | Status: DC | PRN
  Administered 2022-01-20: 4 mg via INTRAVENOUS

## 2022-01-20 MED ORDER — PROPOFOL 10 MG/ML IV BOLUS
INTRAVENOUS | Status: DC | PRN
  Administered 2022-01-20: 40 mg via INTRAVENOUS
  Administered 2022-01-20: 130 mg via INTRAVENOUS

## 2022-01-20 MED ORDER — HYDROMORPHONE HCL 1 MG/ML IJ SOLN: 0.2500 mg | INTRAMUSCULAR | Status: DC | PRN

## 2022-01-20 MED ORDER — DEXAMETHASONE SODIUM PHOSPHATE 10 MG/ML IJ SOLN
INTRAMUSCULAR | Status: DC | PRN
  Administered 2022-01-20: 5 mg via INTRAVENOUS

## 2022-01-20 MED ORDER — ACETAMINOPHEN 500 MG PO TABS: 1000.0000 mg | ORAL_TABLET | ORAL | Status: AC

## 2022-01-20 MED ORDER — ONDANSETRON HCL 4 MG/2ML IJ SOLN
INTRAMUSCULAR | Status: AC
  Filled 2022-01-20: qty 2

## 2022-01-20 MED ORDER — AMISULPRIDE (ANTIEMETIC) 5 MG/2ML IV SOLN: 10.0000 mg | Freq: Once | INTRAVENOUS | Status: DC | PRN

## 2022-01-20 MED ORDER — PROPOFOL 500 MG/50ML IV EMUL
INTRAVENOUS | Status: DC | PRN
  Administered 2022-01-20: 175 ug/kg/min via INTRAVENOUS

## 2022-01-20 MED ORDER — ACETAMINOPHEN 500 MG PO TABS
1000.0000 mg | ORAL_TABLET | Freq: Once | ORAL | Status: AC
  Administered 2022-01-20: 1000 mg via ORAL

## 2022-01-20 MED ORDER — BUPIVACAINE HCL (PF) 0.25 % IJ SOLN
INTRAMUSCULAR | Status: DC | PRN
  Administered 2022-01-20: 10 mL

## 2022-01-20 MED ORDER — LIDOCAINE HCL (CARDIAC) PF 100 MG/5ML IV SOSY
PREFILLED_SYRINGE | INTRAVENOUS | Status: DC | PRN
  Administered 2022-01-20: 50 mg via INTRATRACHEAL

## 2022-01-20 MED ORDER — OXYCODONE HCL 5 MG PO TABS: 5.0000 mg | ORAL_TABLET | Freq: Once | ORAL | Status: DC | PRN

## 2022-01-20 MED ORDER — FENTANYL CITRATE (PF) 100 MCG/2ML IJ SOLN
INTRAMUSCULAR | Status: DC | PRN
  Administered 2022-01-20 (×2): 25 ug via INTRAVENOUS
  Administered 2022-01-20: 50 ug via INTRAVENOUS

## 2022-01-20 MED ORDER — OXYCODONE HCL 5 MG/5ML PO SOLN: 5.0000 mg | Freq: Once | ORAL | Status: DC | PRN

## 2022-01-20 MED ORDER — LACTATED RINGERS IV SOLN: INTRAVENOUS | Status: DC

## 2022-01-20 MED ORDER — ONDANSETRON HCL 4 MG/2ML IJ SOLN: 4.0000 mg | Freq: Once | INTRAMUSCULAR | Status: DC | PRN

## 2022-01-20 MED ORDER — ACETAMINOPHEN 500 MG PO TABS
ORAL_TABLET | ORAL | Status: AC
  Filled 2022-01-20: qty 2

## 2022-01-20 MED ORDER — FENTANYL CITRATE (PF) 100 MCG/2ML IJ SOLN
INTRAMUSCULAR | Status: AC
  Filled 2022-01-20: qty 2

## 2022-01-20 MED ORDER — PROPOFOL 10 MG/ML IV BOLUS
INTRAVENOUS | Status: AC
  Filled 2022-01-20: qty 20

## 2022-01-20 SURGICAL SUPPLY — 55 items
ADH SKN CLS APL DERMABOND .7 (GAUZE/BANDAGES/DRESSINGS) ×1
APL PRP STRL LF DISP 70% ISPRP (MISCELLANEOUS) ×1
APPLIER CLIP 9.375 MED OPEN (MISCELLANEOUS)
APR CLP MED 9.3 20 MLT OPN (MISCELLANEOUS)
BINDER BREAST LRG (GAUZE/BANDAGES/DRESSINGS) IMPLANT
BINDER BREAST MEDIUM (GAUZE/BANDAGES/DRESSINGS) IMPLANT
BINDER BREAST XLRG (GAUZE/BANDAGES/DRESSINGS) IMPLANT
BINDER BREAST XXLRG (GAUZE/BANDAGES/DRESSINGS) IMPLANT
BLADE SURG 15 STRL LF DISP TIS (BLADE) ×1 IMPLANT
BLADE SURG 15 STRL SS (BLADE) ×1
CANISTER SUC SOCK COL 7IN (MISCELLANEOUS) IMPLANT
CANISTER SUCT 1200ML W/VALVE (MISCELLANEOUS) IMPLANT
CHLORAPREP W/TINT 26 (MISCELLANEOUS) ×1 IMPLANT
CLIP APPLIE 9.375 MED OPEN (MISCELLANEOUS) IMPLANT
CLIP TI WIDE RED SMALL 6 (CLIP) IMPLANT
COVER BACK TABLE 60X90IN (DRAPES) ×1 IMPLANT
COVER MAYO STAND STRL (DRAPES) ×1 IMPLANT
COVER PROBE CYLINDRICAL 5X96 (MISCELLANEOUS) ×1 IMPLANT
DERMABOND ADVANCED .7 DNX12 (GAUZE/BANDAGES/DRESSINGS) ×1 IMPLANT
DRAPE LAPAROSCOPIC ABDOMINAL (DRAPES) ×1 IMPLANT
DRAPE UTILITY XL STRL (DRAPES) ×1 IMPLANT
DRSG TEGADERM 4X4.75 (GAUZE/BANDAGES/DRESSINGS) IMPLANT
ELECT COATED BLADE 2.86 ST (ELECTRODE) ×1 IMPLANT
ELECT REM PT RETURN 9FT ADLT (ELECTROSURGICAL) ×1
ELECTRODE REM PT RTRN 9FT ADLT (ELECTROSURGICAL) ×1 IMPLANT
GAUZE SPONGE 4X4 12PLY STRL LF (GAUZE/BANDAGES/DRESSINGS) IMPLANT
GLOVE BIO SURGEON STRL SZ7 (GLOVE) ×2 IMPLANT
GLOVE BIOGEL PI IND STRL 7.5 (GLOVE) ×1 IMPLANT
GOWN STRL REUS W/ TWL LRG LVL3 (GOWN DISPOSABLE) ×2 IMPLANT
GOWN STRL REUS W/TWL LRG LVL3 (GOWN DISPOSABLE) ×2
HEMOSTAT ARISTA ABSORB 3G PWDR (HEMOSTASIS) IMPLANT
KIT MARKER MARGIN INK (KITS) ×1 IMPLANT
NDL HYPO 25X1 1.5 SAFETY (NEEDLE) ×1 IMPLANT
NEEDLE HYPO 25X1 1.5 SAFETY (NEEDLE) ×1 IMPLANT
NS IRRIG 1000ML POUR BTL (IV SOLUTION) IMPLANT
PACK BASIN DAY SURGERY FS (CUSTOM PROCEDURE TRAY) ×1 IMPLANT
PENCIL SMOKE EVACUATOR (MISCELLANEOUS) ×1 IMPLANT
RETRACTOR ONETRAX LX 90X20 (MISCELLANEOUS) IMPLANT
SLEEVE SCD COMPRESS KNEE MED (STOCKING) ×1 IMPLANT
SPIKE FLUID TRANSFER (MISCELLANEOUS) IMPLANT
SPONGE T-LAP 4X18 ~~LOC~~+RFID (SPONGE) ×1 IMPLANT
STRIP CLOSURE SKIN 1/2X4 (GAUZE/BANDAGES/DRESSINGS) ×1 IMPLANT
SUT MNCRL AB 4-0 PS2 18 (SUTURE) ×1 IMPLANT
SUT MON AB 5-0 PS2 18 (SUTURE) IMPLANT
SUT SILK 2 0 SH (SUTURE) IMPLANT
SUT VIC AB 2-0 SH 27 (SUTURE) ×1
SUT VIC AB 2-0 SH 27XBRD (SUTURE) ×1 IMPLANT
SUT VIC AB 3-0 SH 27 (SUTURE) ×1
SUT VIC AB 3-0 SH 27X BRD (SUTURE) ×1 IMPLANT
SUT VIC AB 5-0 PS2 18 (SUTURE) IMPLANT
SYR CONTROL 10ML LL (SYRINGE) ×1 IMPLANT
TOWEL GREEN STERILE FF (TOWEL DISPOSABLE) ×1 IMPLANT
TRAY FAXITRON CT DISP (TRAY / TRAY PROCEDURE) ×1 IMPLANT
TUBE CONNECTING 20X1/4 (TUBING) IMPLANT
YANKAUER SUCT BULB TIP NO VENT (SUCTIONS) IMPLANT

## 2022-01-20 NOTE — Interval H&P Note (Signed)
History and Physical Interval Note:  01/20/2022 2:48 PM  Melissa Frey  has presented today for surgery, with the diagnosis of LEFT BREAST CANCER.  The various methods of treatment have been discussed with the patient and family. After consideration of risks, benefits and other options for treatment, the patient has consented to  Procedure(s) with comments: LEFT BREAST LUMPECTOMY WITH RADIOACTIVE SEED LOCALIZATION (Left) - 60 MIN ROOM 8 as a surgical intervention.  The patient's history has been reviewed, patient examined, no change in status, stable for surgery.  I have reviewed the patient's chart and labs.  Questions were answered to the patient's satisfaction.     Rolm Bookbinder

## 2022-01-20 NOTE — Anesthesia Procedure Notes (Signed)
Procedure Name: LMA Insertion Date/Time: 01/20/2022 3:56 PM  Performed by: Glory Buff, CRNAPre-anesthesia Checklist: Patient identified, Emergency Drugs available, Suction available and Patient being monitored Patient Re-evaluated:Patient Re-evaluated prior to induction Oxygen Delivery Method: Circle system utilized Preoxygenation: Pre-oxygenation with 100% oxygen Induction Type: IV induction LMA: LMA inserted LMA Size: 4.0 Number of attempts: 1 Placement Confirmation: positive ETCO2 Tube secured with: Tape Dental Injury: Teeth and Oropharynx as per pre-operative assessment

## 2022-01-20 NOTE — H&P (Signed)
76 yof healthy female with fh in her mom at age 76, no mass or dc, had a screening mm that shows b density breast tissue. She has calcifications in the left UMQ that measure 8 mm. Ax [place] was negative. Clip migrated. 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 She lives in [place] and is retired. 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 I have reviewed this information and discussed as appropriate with the patient. See HPI as well for other ROS.  Review of Systems  Musculoskeletal: Positive for joint pain.  All other systems reviewed and are negative.  Medical History: Past Medical History:  Diagnosis Date  Arthritis  GERD (gastroesophageal reflux disease)   Patient Active Problem List  Diagnosis  Age-related osteoporosis without current pathological fracture  Wart  Pain in thoracic spine  Lumbar pain  Joint pain  Hx of adenomatous colonic polyps  Hardening of the aorta (main artery of the heart) (CMS-HCC)  Generalized enlarged lymph nodes  Gait abnormality  Dysphagia  Ductal carcinoma in situ (DCIS) of left breast  Disorder of musculoskeletal system  Itching   Past Surgical History:  Procedure Laterality Date  .Right foot surgery Right 02/2012  CATARACT EXTRACTION N/A 2015    Allergies  Allergen Reactions  Covid-19 Vacc,Mrna(Moderna)-Pf Swelling  Reaction to booster shot 12/19/2019 - severe swelling/arthritis/joint pain/raised bumps - no reaction to 1st 2 shots   Current Outpatient Medications on File Prior to Visit  Medication Sig Dispense Refill  alendronate (FOSAMAX) 70 MG tablet Take 70 mg by mouth every 7 (seven) days  rosuvastatin (CRESTOR) 10 MG tablet Take 10 mg by mouth once daily   Family History  Problem Relation Age of Onset  Breast cancer Mother  Coronary Artery Disease (Blocked arteries around heart) Mother    Social History   Tobacco Use  Smoking Status Former   Types: Cigarettes  Quit date: 1971  Years since quitting: 52.9  Smokeless Tobacco Never  Marital status: Single  Tobacco Use  Smoking status: Former  Types: Cigarettes  Quit date: 1971  Years since quitting: 52.9  Smokeless tobacco: Never  Vaping Use  Vaping Use: Never used  Substance and Sexual Activity  Alcohol use: Yes  Drug use: Never  Sexual activity: Defer   Objective:    Physical Exam Vitals reviewed.  Constitutional:  Appearance: Normal appearance.  Chest:  Breasts: Right: No inverted nipple, mass or nipple discharge.  Left: No inverted nipple, mass or nipple discharge.  Lymphadenopathy:  Upper Body:  Right upper body: No supraclavicular or axillary adenopathy.  Left upper body: No supraclavicular or axillary adenopathy.  Neurological:  Mental Status: She is alert.   Assessment and Plan:   Ductal carcinoma in situ (DCIS) of left breast  Left breast seed guided lumpectomy  We discussed the staging and pathophysiology of breast cancer. We discussed DCIS and stage 0 nature of this.   She does not need a sentinel node biopsy for this.  We discussed the options for treatment of the breast cancer which included lumpectomy versus a mastectomy. We discussed the performance of the lumpectomy with radioactive seed placement. We discussed a 5% chance of a positive margin requiring reexcision in the operating room. We also discussed that she might be recommended radiation therapy if she undergoes lumpectomy. We discussed mastectomy and the postoperative care for that as well. Mastectomy can be followed by reconstruction.Most mastectomy patients will not need  radiation therapy. We discussed that there is no difference in her survival whether she undergoes lumpectomy with radiation therapy or antiestrogen therapy versus a mastectomy. There is also no real difference between her recurrence in the breast.  We discussed the risks of operation including bleeding, infection,  possible reoperation. She understands her further therapy will be based on what her stages at the time of her operation.

## 2022-01-20 NOTE — Transfer of Care (Signed)
Immediate Anesthesia Transfer of Care Note  Patient: Melissa Frey  Procedure(s) Performed: LEFT BREAST LUMPECTOMY WITH RADIOACTIVE SEED LOCALIZATION (Left: Breast)  Patient Location: PACU  Anesthesia Type:General  Level of Consciousness: drowsy, patient cooperative, and responds to stimulation  Airway & Oxygen Therapy: Patient Spontanous Breathing and Patient connected to face mask oxygen  Post-op Assessment: Report given to RN and Post -op Vital signs reviewed and stable  Post vital signs: Reviewed and stable  Last Vitals:  Vitals Value Taken Time  BP    Temp    Pulse 94 01/20/22 1638  Resp    SpO2 97 % 01/20/22 1638  Vitals shown include unvalidated device data.  Last Pain:  Vitals:   01/20/22 1425  TempSrc: Oral  PainSc: 0-No pain      Patients Stated Pain Goal: 3 (82/08/13 8871)  Complications: No notable events documented.

## 2022-01-20 NOTE — Op Note (Signed)
Preoperative diagnosis: Left breast cancer clinical stage 0 Postoperative diagnosis: Same as above Procedure: Left breast radioactive seed guided lumpectomy Surgeon: Dr. Serita Grammes Anesthesia: General Estimate blood loss: Minimal Complications: None Drains: None Specimens: 1.  Left breast tissue marked with paint containing seed  2.  Additional posterior and lateral margins marked short superior, long lateral, double deep Sponge count was correct completion Disposition recovery stable condition   Indications:76 yof healthy female with fh in her mom at age 67, no mass or dc, had a screening mm that shows b density breast tissue. She has calcifications in the left UMQ that measure 8 mm. Ax [place] was negative. Clip migrated. 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 We discussed proceeding with lumpectomy.    Procedure: After informed consent was obtained the patient was taken to the operating room.  She was given antibiotics.  SCDs were in place.  She was placed under general anesthesia without complication.  She was prepped and draped in standard sterile surgical fashion.  Surgical timeout was then performed.   I located the tumor in the upper central breast.  I elected to make an incision overlying it as it was quite close to the skin.  I then dissected down to the seed.  I removed the seed and the surrounding tissue with an attempt at getting a clear margin.   I then did a mammogram after marking this with paint and confirmed that I remove the seed.  The 3D images showed I was close to several margins and I removed these as listed as above.  The posterior margin is now the muscle.  I then closed the tissue after placing some clips with 2-0 Vicryl suture.  The skin was closed with 3-0 Vicryl for Monocryl.  Glue and Steri-Strips were applied.  She tolerated this well was extubated transferred recovery stable.

## 2022-01-20 NOTE — Discharge Instructions (Addendum)
No tylenol until 8:30 p.m.   [facility] Phone Number [phone]  POST OP INSTRUCTIONS Take 400 mg of ibuprofen every 8 hours or 650 mg tylenol every 6 hours for next 72 hours then as needed. Use ice several times daily also.  A prescription for pain medication may be given to you upon discharge.  Take your pain medication as prescribed, if needed.  If narcotic pain medicine is not needed, then you may take acetaminophen (Tylenol), naprosyn (Alleve) or ibuprofen (Advil) as needed. Take your usually prescribed medications unless otherwise directed If you need a refill on your pain medication, please contact your pharmacy.  They will contact our office to request authorization.  Prescriptions will not be filled after 5pm or on week-ends. You should eat very light the first 24 hours after surgery, such as soup, crackers, pudding, etc.  Resume your normal diet the day after surgery. Most patients will experience some swelling and bruising in the breast.  Ice packs and a good support bra will help.  Wear the breast binder provided or a sports bra for 72 hours day and night.  After that wear a sports bra during the day until you return to the office. Swelling and bruising can take several days to resolve.  It is common to experience some constipation if taking pain medication after surgery.  Increasing fluid intake and taking a stool softener will usually help or prevent this problem from occurring.  A mild laxative (Milk of Magnesia or Miralax) should be taken according to package directions if there are no bowel movements after 48 hours. I used skin glue on the incision, you may shower in 24 hours.  The glue will flake off over the next 2-3 weeks.  Any sutures or staples will be removed at the office during your follow-up visit. ACTIVITIES:  You may resume regular daily activities (gradually increasing) beginning the next day.  Wearing a good support bra or sports bra minimizes  pain and swelling.  You may have sexual intercourse when it is comfortable. You may drive when you no longer are taking prescription pain medication, you can comfortably wear a seatbelt, and you can safely maneuver your car and apply brakes. RETURN TO WORK:  ______________________________________________________________________________________ Dennis Bast should see your doctor in the office for a follow-up appointment approximately two weeks after your surgery.  Your doctor's nurse will typically make your follow-up appointment when she calls you with your pathology report.  Expect your pathology report 3-4 business days after your surgery.  You may call to check if you do not hear from [place] after three days. OTHER INSTRUCTIONS: _______________________________________________________________________________________________ _____________________________________________________________________________________________________________________________________ _____________________________________________________________________________________________________________________________________ _____________________________________________________________________________________________________________________________________  WHEN TO CALL DR WAKEFIELD: Fever over 101.0 Nausea and/or vomiting. Extreme swelling or bruising. Continued bleeding from incision. Increased pain, redness, or drainage from the incision.  The clinic staff is available to answer your questions during regular business hours.  Please don't hesitate to call and ask to speak to one of the nurses for clinical concerns.  If you have a medical emergency, go to the nearest emergency room or call 911.  A surgeon from [facility] is always on call at the hospital.  For further questions, please visit [email or web address]   Post Anesthesia Home Care Instructions  Activity: Get plenty of rest for the remainder of the day. A  responsible individual must stay with you for 24 hours following the procedure.  For the next 24 hours, DO NOT: -Drive a car -Paediatric nurse -Drink alcoholic  beverages -Take any medication unless instructed by your physician -Make any legal decisions or sign important papers.  Meals: Start with liquid foods such as gelatin or soup. Progress to regular foods as tolerated. Avoid greasy, spicy, heavy foods. If nausea and/or vomiting occur, drink only clear liquids until the nausea and/or vomiting subsides. Call your physician if vomiting continues.  Special Instructions/Symptoms: Your throat may feel dry or sore from the anesthesia or the breathing tube placed in your throat during surgery. If this causes discomfort, gargle with warm salt water. The discomfort should disappear within 24 hours.  If you had a scopolamine patch placed behind your ear for the management of post- operative nausea and/or vomiting:  1. The medication in the patch is effective for 72 hours, after which it should be removed.  Wrap patch in a tissue and discard in the trash. Wash hands thoroughly with soap and water. 2. You may remove the patch earlier than 72 hours if you experience unpleasant side effects which may include dry mouth, dizziness or visual disturbances. 3. Avoid touching the patch. Wash your hands with soap and water after contact with the patch.    Post Anesthesia Home Care Instructions  Activity: Get plenty of rest for the remainder of the day. A responsible individual must stay with you for 24 hours following the procedure.  For the next 24 hours, DO NOT: -Drive a car -Paediatric nurse -Drink alcoholic beverages -Take any medication unless instructed by your physician -Make any legal decisions or sign important papers.  Meals: Start with liquid foods such as gelatin or soup. Progress to regular foods as tolerated. Avoid greasy, spicy, heavy foods. If nausea and/or vomiting occur, drink only  clear liquids until the nausea and/or vomiting subsides. Call your physician if vomiting continues.  Special Instructions/Symptoms: Your throat may feel dry or sore from the anesthesia or the breathing tube placed in your throat during surgery. If this causes discomfort, gargle with warm salt water. The discomfort should disappear within 24 hours.  If you had a scopolamine patch placed behind your ear for the management of post- operative nausea and/or vomiting:  1. The medication in the patch is effective for 72 hours, after which it should be removed.  Wrap patch in a tissue and discard in the trash. Wash hands thoroughly with soap and water. 2. You may remove the patch earlier than 72 hours if you experience unpleasant side effects which may include dry mouth, dizziness or visual disturbances. 3. Avoid touching the patch. Wash your hands with soap and water after contact with the patch.

## 2022-01-20 NOTE — Anesthesia Preprocedure Evaluation (Addendum)
Anesthesia Evaluation  Patient identified by MRN, date of birth, ID band Patient awake    Reviewed: Allergy & Precautions, NPO status , Patient's Chart, lab work & pertinent test results  Airway Mallampati: II  TM Distance: >3 FB Neck ROM: Full    Dental no notable dental hx. (+) Dental Advisory Given, Teeth Intact   Pulmonary former smoker   Pulmonary exam normal breath sounds clear to auscultation       Cardiovascular negative cardio ROS Normal cardiovascular exam Rhythm:Regular Rate:Normal     Neuro/Psych negative neurological ROS  negative psych ROS   GI/Hepatic Neg liver ROS,GERD  Controlled,,  Endo/Other  negative endocrine ROS    Renal/GU negative Renal ROS  negative genitourinary   Musculoskeletal negative musculoskeletal ROS (+)    Abdominal   Peds  Hematology negative hematology ROS (+)   Anesthesia Other Findings   Reproductive/Obstetrics negative OB ROS                             Anesthesia Physical Anesthesia Plan  ASA: 2  Anesthesia Plan: General   Post-op Pain Management: Tylenol PO (pre-op)*   Induction: Intravenous  PONV Risk Score and Plan: 3 and Ondansetron, Dexamethasone and Treatment may vary due to age or medical condition  Airway Management Planned: LMA  Additional Equipment:   Intra-op Plan:   Post-operative Plan: Extubation in OR  Informed Consent: I have reviewed the patients History and Physical, chart, labs and discussed the procedure including the risks, benefits and alternatives for the proposed anesthesia with the patient or authorized representative who has indicated his/her understanding and acceptance.     Dental advisory given  Plan Discussed with: CRNA  Anesthesia Plan Comments:        Anesthesia Quick Evaluation

## 2022-01-21 ENCOUNTER — Ambulatory Visit
Admission: RE | Admit: 2022-01-21 | Discharge: 2022-01-21 | Disposition: A | Discharge status: Home / Self Care | Payer: Self-pay | Source: Ambulatory Visit | Attending: Radiation Oncology | Admitting: Radiation Oncology

## 2022-01-21 ENCOUNTER — Inpatient Hospital Stay
Admission: RE | Admit: 2022-01-21 | Discharge: 2022-01-21 | Disposition: A | Discharge status: Home / Self Care | Payer: Self-pay | Source: Ambulatory Visit | Attending: Radiation Oncology | Admitting: Radiation Oncology

## 2022-01-21 ENCOUNTER — Other Ambulatory Visit: Payer: Self-pay | Admitting: Radiation Oncology

## 2022-01-21 DIAGNOSIS — D0512 Intraductal carcinoma in situ of left breast: Secondary | ICD-10-CM

## 2022-01-22 ENCOUNTER — Encounter (HOSPITAL_BASED_OUTPATIENT_CLINIC_OR_DEPARTMENT_OTHER): Payer: Self-pay | Admitting: General Surgery

## 2022-01-22 LAB — SURGICAL PATHOLOGY

## 2022-01-23 DIAGNOSIS — R531 Weakness: Secondary | ICD-10-CM | POA: Diagnosis not present

## 2022-01-23 DIAGNOSIS — M19012 Primary osteoarthritis, left shoulder: Secondary | ICD-10-CM | POA: Diagnosis not present

## 2022-01-23 DIAGNOSIS — M25612 Stiffness of left shoulder, not elsewhere classified: Secondary | ICD-10-CM | POA: Diagnosis not present

## 2022-01-23 NOTE — Anesthesia Postprocedure Evaluation (Signed)
Anesthesia Post Note  Patient: Melissa Frey  Procedure(s) Performed: LEFT BREAST LUMPECTOMY WITH RADIOACTIVE SEED LOCALIZATION (Left: Breast)     Patient location during evaluation: PACU Anesthesia Type: General Level of consciousness: awake and alert Pain management: pain level controlled Vital Signs Assessment: post-procedure vital signs reviewed and stable Respiratory status: spontaneous breathing, nonlabored ventilation, respiratory function stable and patient connected to nasal cannula oxygen Cardiovascular status: blood pressure returned to baseline and stable Postop Assessment: no apparent nausea or vomiting Anesthetic complications: no   No notable events documented.  Last Vitals:  Vitals:   01/20/22 1645 01/20/22 1710  BP: 133/73 (!) 149/69  Pulse: 86 69  Resp: 15 16  Temp:  (!) 36.1 C  SpO2: 100% 96%    Last Pain:  Vitals:   01/21/22 1030  TempSrc:   PainSc: 0-No pain                 Tiajuana Amass

## 2022-01-29 ENCOUNTER — Encounter: Payer: Self-pay | Admitting: *Deleted

## 2022-02-04 DIAGNOSIS — K2900 Acute gastritis without bleeding: Secondary | ICD-10-CM | POA: Diagnosis not present

## 2022-02-04 DIAGNOSIS — R5383 Other fatigue: Secondary | ICD-10-CM | POA: Diagnosis not present

## 2022-02-04 DIAGNOSIS — R110 Nausea: Secondary | ICD-10-CM | POA: Diagnosis not present

## 2022-02-04 DIAGNOSIS — Z1152 Encounter for screening for COVID-19: Secondary | ICD-10-CM | POA: Diagnosis not present

## 2022-02-06 NOTE — Progress Notes (Signed)
Patient Care Team: Velna Hatchet, MD as PCP - General (Internal Medicine) Gildardo Cranker, MD as Orthopedic Technician (Orthopedic Surgery) Geoffery Lyons, NP as Nurse Practitioner (Family Medicine) Hennie Duos, MD as Consulting Physician (Rheumatology) Mauro Kaufmann, RN as Oncology Nurse Navigator Rockwell Germany, RN as Oncology Nurse Navigator Nicholas Lose, MD as Consulting Physician (Hematology and Oncology) Kyung Rudd, MD as Consulting Physician (Radiation Oncology) Rolm Bookbinder, MD as Consulting Physician (General Surgery)  DIAGNOSIS:  Encounter Diagnosis  Name Primary?   Ductal carcinoma in situ (DCIS) of left breast Yes    SUMMARY OF ONCOLOGIC HISTORY: Oncology History  Ductal carcinoma in situ (DCIS) of left breast  12/23/2021 Initial Diagnosis   Screening detected left breast calcifications upper medial quadrant 0.8 cm, axilla negative, biopsy: Intermediate grade DCIS ER 95%, PR 50%   01/08/2022 Cancer Staging   Staging form: Breast, AJCC 8th Edition - Clinical: Stage 0 (cTis (DCIS), cN0, cM0, G2, ER+, PR+, HER2: Not Assessed) - Signed by Nicholas Lose, MD on 01/08/2022 Stage prefix: Initial diagnosis Histologic grading system: 3 grade system   01/15/2022 Genetic Testing   Negative hereditary cancer genetic testing: no pathogenic variants detected in [facility] Common Hereditary Cancers +RNA Panel.  Report date is 01/15/2022.    The [facility] Common Hereditary Cancers + RNA Panel includes sequencing, deletion/duplication, and RNA analysis of the following 48 genes: APC, ATM, AXIN2, BAP1, BARD1, BMPR1A, BRCA1, BRCA2, BRIP1, CDH1, CDK4*, CDKN2A*, CHEK2, CTNNA1, DICER1, EPCAM* (del/dup only), FH, GREM1* (promoter dup analysis only), HOXB13*, KIT*, MBD4*, MEN1, MLH1, MSH2, MSH3, MSH6, MUTYH, NF1, NTHL1, PALB2, PDGFRA*, PMS2, POLD1, POLE, PTEN, RAD51C, RAD51D, SDHA (sequencing only), SDHB, SDHC, SDHD, SMAD4, SMARCA4, STK11, TP53, TSC1, TSC2, VHL.  *Genes without  RNA analysis.      CHIEF COMPLIANT: Follow-up after surgery  INTERVAL HISTORY: Melissa Frey is a 77 y.o. female is here because of recent diagnosis of left breast DCIS.  Patient had screening mammogram that detected calcifications left breast 0.8 cm. She presents to the clinic for a follow-up.    ALLERGIES:  is allergic to covid-19 mrna vacc (moderna).  MEDICATIONS:  Current Outpatient Medications  Medication Sig Dispense Refill   tamoxifen (NOLVADEX) 10 MG tablet Take 1 tablet (10 mg total) by mouth daily. 90 tablet 3   acetaminophen (TYLENOL) 650 MG CR tablet Take 1,300 mg by mouth at bedtime.     alendronate (FOSAMAX) 70 MG tablet Take 70 mg by mouth once a week.     ascorbic acid (VITAMIN C) 500 MG tablet Take 500 mg by mouth daily.     BIOTIN PO Take 1 tablet by mouth daily.     Calcium Carbonate-Vitamin D (CALCIUM-D PO) Take 1 tablet by mouth daily.     Coenzyme Q10 (COQ10 PO) Take 1 capsule by mouth daily.     conjugated estrogens (PREMARIN) vaginal cream Place 1 Applicatorful vaginally as needed (dryness).     Multiple Vitamin (MULTIVITAMIN WITH MINERALS) TABS tablet Take 1 tablet by mouth daily.     rosuvastatin (CRESTOR) 10 MG tablet Take 1 tablet by mouth once daily (Patient taking differently: Take 10 mg by mouth at bedtime.) 90 tablet 0   No current facility-administered medications for this visit.    PHYSICAL EXAMINATION: ECOG PERFORMANCE STATUS: 1 - Symptomatic but completely ambulatory  Vitals:   02/13/22 1139  BP: 127/73  Pulse: (!) 115  Temp: 97.9 F (36.6 C)  SpO2: 98%   Filed Weights   02/13/22 1139  Weight: 118  lb 6.4 oz (53.7 kg)      LABORATORY DATA:  I have reviewed the data as listed    Latest Ref Rng & Units 01/08/2022    8:13 AM 02/26/2020   12:38 PM 01/24/2020    3:27 PM  CMP  Glucose 70 - 99 mg/dL 88  97  113   BUN 8 - 23 mg/dL _0 Creatinine 0.44 - 1.00 mg/dL 0.85  0.88  0.89   Sodium 135 - 145 mmol/L 140  141  141    Potassium 3.5 - 5.1 mmol/L 4.2  3.9  4.2   Chloride 98 - 111 mmol/L 104  104  104   CO2 22 - 32 mmol/L 32  28  24   Calcium 8.9 - 10.3 mg/dL 9.9  9.6  9.1   Total Protein 6.5 - 8.1 g/dL 6.9   6.8   Total Bilirubin 0.3 - 1.2 mg/dL 0.5   0.3   Alkaline Phos 38 - 126 U/L 49   59   AST 15 - 41 U/L 21   20   ALT 0 - 44 U/L 13   12     Lab Results  Component Value Date   WBC 4.7 01/08/2022   HGB 13.3 01/08/2022   HCT 40.3 01/08/2022   MCV 93.5 01/08/2022   PLT 193 01/08/2022   NEUTROABS 2.4 01/08/2022    ASSESSMENT & PLAN:  Ductal carcinoma in situ (DCIS) of left breast 12/23/2021:Screening detected left breast calcifications upper medial quadrant 0.8 cm, axilla negative, biopsy: Intermediate grade DCIS ER 95%, PR 50%   01/20/2022: Left lumpectomy: 1 cm intermediate grade DCIS, no invasive cancer, margins negative, ER 95%, PR 50%  Pathology counseling: I discussed the final pathology report of the patient provided  a copy of this report. I discussed the margins as well as lymph node surgeries. We also discussed the final staging along with previously performed ER/PR and HER-2/neu testing.  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 She has a left rotator cuff injury.  [place] and [place] had been to [place].  The rotator cuff injury is much better since she received a cortisone injection into the shoulder.  Return to clinic after radiation is complete.  No orders of the defined types were placed in this encounter.  The patient has a good understanding of the overall plan. she agrees with it. she will call with any problems that may develop before the next visit here. Total time spent: 30 mins including face to face time and time spent for planning, charting and co-ordination of care   Harriette Ohara, MD 02/14/22    I Gardiner Coins am acting as a Education administrator for [employer]  I have reviewed the above documentation for accuracy and completeness, and I agree with the above.

## 2022-02-07 ENCOUNTER — Ambulatory Visit: Payer: PPO | Admitting: Hematology and Oncology

## 2022-02-12 DIAGNOSIS — L570 Actinic keratosis: Secondary | ICD-10-CM | POA: Diagnosis not present

## 2022-02-12 DIAGNOSIS — C44519 Basal cell carcinoma of skin of other part of trunk: Secondary | ICD-10-CM | POA: Diagnosis not present

## 2022-02-12 DIAGNOSIS — D485 Neoplasm of uncertain behavior of skin: Secondary | ICD-10-CM | POA: Diagnosis not present

## 2022-02-13 ENCOUNTER — Inpatient Hospital Stay: Payer: PPO | Attending: Hematology and Oncology | Admitting: Hematology and Oncology

## 2022-02-13 VITALS — BP 127/73 | HR 115 | Temp 97.9°F | Wt 118.4 lb

## 2022-02-13 DIAGNOSIS — D0512 Intraductal carcinoma in situ of left breast: Secondary | ICD-10-CM | POA: Insufficient documentation

## 2022-02-13 MED ORDER — TAMOXIFEN CITRATE 10 MG PO TABS: 10.0000 mg | ORAL_TABLET | Freq: Every day | ORAL | 3 refills | Status: DC

## 2022-02-13 NOTE — Assessment & Plan Note (Signed)
12/23/2021:Screening detected left breast calcifications upper medial quadrant 0.8 cm, axilla negative, biopsy: Intermediate grade DCIS ER 95%, PR 50%   01/20/2022: Left lumpectomy: 1 cm intermediate grade DCIS, no invasive cancer, margins negative, ER 95%, PR 50%  Pathology counseling: I discussed the final pathology report of the patient provided  a copy of this report. I discussed the margins as well as lymph node surgeries. We also discussed the final staging along with previously performed ER/PR and HER-2/neu testing.  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 She has a left rotator cuff injury.  [place] had been to [place].

## 2022-02-17 NOTE — Progress Notes (Incomplete)
Radiation Oncology         ([phone]) [phone] ________________________________  Name: Melissa Frey        MRN: [id number]  Date of Service: 02/20/2022 DOB: [birth year]  CC:Velna Hatchet, MD  Nicholas Lose, MD     REFERRING PHYSICIAN: Nicholas Lose, MD   DIAGNOSIS: The encounter diagnosis was Ductal carcinoma in situ (DCIS) of left breast.   HISTORY OF PRESENT ILLNESS: Melissa Frey is a 77 y.o. female originally seen in the multidisciplinary breast clinic for a new diagnosis of left breast cancer. The patient was noted to have screening detected calcifications in the left breast.  Further diagnostic workup showed amorphous group of calcifications in the left upper inner quadrant with a possible asymmetry.  The patient underwent ultrasound but this did not have a sonographic correlate in the breast and no abnormalities were seen in the left axilla.  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  Her cancer was ER/PR positive.   Since her last visit, she has undergone a left lumpectomy on 01/20/22 that showed a 1 cm area of intermediate grade DCIS. Her margins were negative for disease. She's seen today to discuss adjuvant radiotherapy.    PREVIOUS RADIATION THERAPY: No   PAST MEDICAL HISTORY:  Past Medical History:  Diagnosis Date   Breast cancer ([place])    Cataract    right cateract removed   Diverticulosis    Dysphagia    Gastritis    GERD (gastroesophageal reflux disease)    History of colon polyps    Hyperlipidemia    Internal hemorrhoids    Osteoporosis        PAST SURGICAL HISTORY: Past Surgical History:  Procedure Laterality Date   BELPHAROPTOSIS REPAIR     BREAST LUMPECTOMY WITH RADIOACTIVE SEED LOCALIZATION Left 01/20/2022   Procedure: LEFT BREAST LUMPECTOMY WITH RADIOACTIVE SEED LOCALIZATION;  Surgeon: Rolm Bookbinder, MD;  Location: [facility];  Service: General;  Laterality: Left;  38 MIN ROOM 8   CATARACT EXTRACTION  04/2013   CESAREAN SECTION  1977/1983   x3   FOOT SURGERY Right 02/2012   HEMORRHOID BANDING     skin cancer removal     face     FAMILY HISTORY:  Family History  Problem Relation Age of Onset   Heart failure Mother    Breast cancer Mother 79   Breast cancer Cousin 58       maternal female cousin   Colon cancer Neg Hx    Esophageal cancer Neg Hx    Rectal cancer Neg Hx    Stomach cancer Neg Hx      SOCIAL HISTORY:  reports that she has quit smoking. She has never used smokeless tobacco. She reports current alcohol use of about 7.0 standard drinks of alcohol per week. She reports that she does not use drugs.  The patient is widowed and resides in [place].  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 Her adult daughters live nearby. One is a physical therapist, and the other is a PA.    ALLERGIES: Covid-19 mrna vacc (moderna)   MEDICATIONS:  Current Outpatient Medications  Medication Sig Dispense Refill   acetaminophen (TYLENOL) 650 MG CR tablet Take 1,300 mg by mouth at bedtime.     alendronate (FOSAMAX) 70 MG tablet Take 70 mg by mouth once a week.     ascorbic  acid (VITAMIN C) 500 MG tablet Take 500 mg by mouth daily.     BIOTIN PO Take 1 tablet by mouth daily.     Calcium Carbonate-Vitamin D (CALCIUM-D PO) Take 1 tablet by mouth daily.     Coenzyme Q10 (COQ10 PO) Take 1 capsule by mouth daily.     conjugated estrogens (PREMARIN) vaginal cream Place 1 Applicatorful vaginally as needed (dryness).     Multiple Vitamin (MULTIVITAMIN WITH MINERALS) TABS tablet Take 1 tablet by mouth daily.     rosuvastatin (CRESTOR) 10 MG tablet Take 1 tablet by mouth once daily (Patient taking differently: Take 10 mg by mouth at bedtime.) 90 tablet 0   tamoxifen (NOLVADEX) 10 MG tablet Take 1 tablet (10 mg total) by mouth daily. 90 tablet 3   No  current facility-administered medications for this visit.     REVIEW OF SYSTEMS: On review of systems, the patient reports that she is doing well overall. Since her last visit, her left shoulder ***     PHYSICAL EXAM:  Wt Readings from Last 3 Encounters:  02/13/22 118 lb 6.4 oz (53.7 kg)  01/20/22 123 lb 10.9 oz (56.1 kg)  01/08/22 122 lb 12.8 oz (55.7 kg)   Temp Readings from Last 3 Encounters:  02/13/22 97.9 F (36.6 C)  01/20/22 (!) 97 F (36.1 C)  01/08/22 (!) 97.2 F (36.2 C) (Temporal)   BP Readings from Last 3 Encounters:  02/13/22 127/73  01/20/22 (!) 149/69  01/08/22 (!) 140/63   Pulse Readings from Last 3 Encounters:  02/13/22 (!) 115  01/20/22 69  01/08/22 90    In general this is a well appearing caucasian female in no acute distress. She's alert and oriented x4 and appropriate throughout the examination. Cardiopulmonary assessment is negative for acute distress and she exhibits normal effort. Her left breast has a well healed surgical incision site without erythema, separation, or drainage.     ECOG = ***  0 - Asymptomatic (Fully active, able to carry on all predisease activities without restriction)  1 - Symptomatic but completely ambulatory (Restricted in physically strenuous activity but ambulatory and able to carry out work of a light or sedentary nature. For example, light housework, office work)  2 - Symptomatic, <50% in bed during the day (Ambulatory and capable of all self care but unable to carry out any work activities. Up and about more than 50% of waking hours)  3 - Symptomatic, >50% in bed, but not bedbound (Capable of only limited self-care, confined to bed or chair 50% or more of waking hours)  4 - Bedbound (Completely disabled. Cannot carry on any self-care. Totally confined to bed or chair)  5 - Death   Eustace Pen MM, Creech RH, Tormey DC, et al. [phone]). "Toxicity and response criteria of the [employer]". [place]  Oncol. 5 (6): 649-55    LABORATORY DATA:  Lab Results  Component Value Date   WBC 4.7 01/08/2022   HGB 13.3 01/08/2022   HCT 40.3 01/08/2022   MCV 93.5 01/08/2022   PLT 193 01/08/2022   Lab Results  Component Value Date   NA 140 01/08/2022   K 4.2 01/08/2022   CL 104 01/08/2022   CO2 32 01/08/2022   Lab Results  Component Value Date   ALT 13 01/08/2022   AST 21 01/08/2022   ALKPHOS 49 01/08/2022   BILITOT 0.5 01/08/2022      RADIOGRAPHY: No results found.     IMPRESSION/PLAN: 1.  Intermediate grade, ER/PR positive DCIS of the left breast. Dr. Lisbeth Renshaw has reviewed her final pathology findings and we discussed the nature of early-stage left breast disease. She has done well since her surgery. We reviewed that Dr. Lisbeth Renshaw would recommend external radiotherapy to the breast  to reduce risks of local recurrence.  Dr. Lindi Adie also recommends adjuvant antiestrogen therapy. We discussed the risks, benefits, short, and long term effects of radiotherapy, as well as the curative intent, and the patient is interested in proceeding. I reviewed the delivery and logistics of radiotherapy and Dr. Lisbeth Renshaw recommends 4 weeks of radiotherapy to the left breast with deep inspiration breath-hold technique. Written consent is obtained and placed in the chart, a copy was provided to the patient. She will simulate today.*** 2. Left rotator cuff injury. She ***   In a visit lasting *** minutes, greater than 50% of the time was spent face to face reviewing her case, as well as in preparation of, discussing, and coordinating the patient's care.    Carola Rhine, [facility]    **Disclaimer: This note was dictated with voice recognition software. Similar sounding words can inadvertently be transcribed and this note may contain transcription errors which may not have been corrected upon publication of note.**

## 2022-02-18 ENCOUNTER — Encounter: Payer: Self-pay | Admitting: *Deleted

## 2022-02-18 ENCOUNTER — Telehealth: Payer: Self-pay | Admitting: Radiation Oncology

## 2022-02-18 DIAGNOSIS — D0512 Intraductal carcinoma in situ of left breast: Secondary | ICD-10-CM

## 2022-02-18 DIAGNOSIS — M85852 Other specified disorders of bone density and structure, left thigh: Secondary | ICD-10-CM | POA: Diagnosis not present

## 2022-02-18 DIAGNOSIS — M85851 Other specified disorders of bone density and structure, right thigh: Secondary | ICD-10-CM | POA: Diagnosis not present

## 2022-02-18 NOTE — Telephone Encounter (Signed)
I called and spoke with the patient to follow up after inbasket messages about her care. She is interested in forgoing radiation and Dr. Lisbeth Renshaw is in agreement after reviewing her pathology results. We will follow up with her as needed moving forward.

## 2022-02-20 ENCOUNTER — Ambulatory Visit: Payer: PPO

## 2022-02-20 ENCOUNTER — Ambulatory Visit: Payer: PPO | Admitting: Radiation Oncology

## 2022-02-20 ENCOUNTER — Encounter (HOSPITAL_COMMUNITY): Payer: Self-pay

## 2022-02-20 DIAGNOSIS — D0512 Intraductal carcinoma in situ of left breast: Secondary | ICD-10-CM

## 2022-02-21 DIAGNOSIS — M25512 Pain in left shoulder: Secondary | ICD-10-CM | POA: Diagnosis not present

## 2022-04-01 DIAGNOSIS — L570 Actinic keratosis: Secondary | ICD-10-CM | POA: Diagnosis not present

## 2022-04-01 DIAGNOSIS — C44519 Basal cell carcinoma of skin of other part of trunk: Secondary | ICD-10-CM | POA: Diagnosis not present

## 2022-04-01 DIAGNOSIS — L700 Acne vulgaris: Secondary | ICD-10-CM | POA: Diagnosis not present

## 2022-04-24 DIAGNOSIS — C50912 Malignant neoplasm of unspecified site of left female breast: Secondary | ICD-10-CM | POA: Diagnosis not present

## 2022-04-24 DIAGNOSIS — K2900 Acute gastritis without bleeding: Secondary | ICD-10-CM | POA: Diagnosis not present

## 2022-04-24 DIAGNOSIS — R079 Chest pain, unspecified: Secondary | ICD-10-CM | POA: Diagnosis not present

## 2022-05-07 ENCOUNTER — Ambulatory Visit: Payer: PPO | Admitting: Cardiology

## 2022-05-13 NOTE — Progress Notes (Signed)
Patient referred by Alysia Penna, MD for Chest Pain  Subjective:   Melissa Frey, female    DOB: [birth year], 77 y.o.   MRN: [id number]  *** No chief complaint on file.   *** HPI  77 y.o. caucasian female with a significant PMH of hyperlipidemia, gastritis, GERD, and breast cancer s/p left lumpectomy (01/2022), who presents to the office to establish care and cardiac evaluation of chest pain.     *** Past Medical History:  Diagnosis Date   Breast cancer ([facility])    Cataract    right cateract removed   Diverticulosis    Dysphagia    Gastritis    GERD (gastroesophageal reflux disease)    History of colon polyps    Hyperlipidemia    Internal hemorrhoids    Osteoporosis     *** Past Surgical History:  Procedure Laterality Date   BELPHAROPTOSIS REPAIR     BREAST LUMPECTOMY WITH RADIOACTIVE SEED LOCALIZATION Left 01/20/2022   Procedure: LEFT BREAST LUMPECTOMY WITH RADIOACTIVE SEED LOCALIZATION;  Surgeon: Emelia Loron, MD;  Location: [facility];  Service: General;  Laterality: Left;  60 MIN ROOM 8   CATARACT EXTRACTION  04/2013   CESAREAN SECTION  1977/1983   x3   FOOT SURGERY Right 02/2012   HEMORRHOID BANDING     skin cancer removal     face    *** Social History   Tobacco Use  Smoking Status Former  Smokeless Tobacco Never    Social History   Substance and Sexual Activity  Alcohol Use Yes   Alcohol/week: 7.0 standard drinks of alcohol   Types: 7 Glasses of wine per week   Comment: 5-7 glasses red wine per week per patient.    *** Family History  Problem Relation Age of Onset   Heart failure Mother    Breast cancer Mother 89   Breast cancer Cousin 72       maternal female cousin   Colon cancer Neg Hx    Esophageal cancer Neg Hx    Rectal cancer Neg Hx    Stomach cancer Neg Hx     ***  Current Outpatient Medications:    acetaminophen (TYLENOL) 650 MG CR tablet, Take 1,300 mg by mouth at bedtime., Disp: , Rfl:     alendronate (FOSAMAX) 70 MG tablet, Take 70 mg by mouth once a week., Disp: , Rfl:    ascorbic acid (VITAMIN C) 500 MG tablet, Take 500 mg by mouth daily., Disp: , Rfl:    BIOTIN PO, Take 1 tablet by mouth daily., Disp: , Rfl:    Calcium Carbonate-Vitamin D (CALCIUM-D PO), Take 1 tablet by mouth daily., Disp: , Rfl:    Coenzyme Q10 (COQ10 PO), Take 1 capsule by mouth daily., Disp: , Rfl:    conjugated estrogens (PREMARIN) vaginal cream, Place 1 Applicatorful vaginally as needed (dryness)., Disp: , Rfl:    Multiple Vitamin (MULTIVITAMIN WITH MINERALS) TABS tablet, Take 1 tablet by mouth daily., Disp: , Rfl:    rosuvastatin (CRESTOR) 10 MG tablet, Take 1 tablet by mouth once daily (Patient taking differently: Take 10 mg by mouth at bedtime.), Disp: 90 tablet, Rfl: 0   tamoxifen (NOLVADEX) 10 MG tablet, Take 1 tablet (10 mg total) by mouth daily., Disp: 90 tablet, Rfl: 3   Cardiovascular and other pertinent studies:  Reviewed external labs and tests, independently interpreted  *** EKG ***/***/202***: ***  ***  *** Recent labs: 02/04/2022: Glucose 121, BUN/Cr 10/0.8. EGFR 69.9. Na/K 133/4.2.  Rest of the CMP normal H/H 14.1/41.2. MCV 88.6. Platelets 107 ***HbA1C ***%  12/04/2021 Chol 192, TG 93, HDL 71, LDL 102 Apolipoprotein B 79.0 TSH 0.68 Vitamin D 34.6  *** ROS      *** There were no vitals filed for this visit.   There is no height or weight on file to calculate BMI. There were no vitals filed for this visit.  *** Objective:   Physical Exam    ***     Visit diagnoses:   ICD-10-CM   1. Precordial pain  R07.2        No orders of the defined types were placed in this encounter.    Medication changes this visit: There are no discontinued medications.  No orders of the defined types were placed in this encounter.    Assessment & Recommendations:   77 y.o. caucasian female with a significant PMH of hyperlipidemia, gastritis, GERD, and breast cancer, who  presents to the office to establish care and cardiac evaluation of chest pain.   ***  Thank you for referring the patient to [place]. Please feel free to contact with any questions.   Elder Negus, MD Pager: [phone] Office: [phone]

## 2022-05-14 ENCOUNTER — Encounter: Payer: Self-pay | Admitting: Cardiology

## 2022-05-14 ENCOUNTER — Ambulatory Visit: Payer: PPO | Admitting: Cardiology

## 2022-05-14 VITALS — BP 121/67 | HR 77 | Resp 16 | Ht 60.0 in | Wt 115.0 lb

## 2022-05-14 DIAGNOSIS — R072 Precordial pain: Secondary | ICD-10-CM | POA: Diagnosis not present

## 2022-05-14 DIAGNOSIS — I700 Atherosclerosis of aorta: Secondary | ICD-10-CM | POA: Diagnosis not present

## 2022-05-20 ENCOUNTER — Encounter: Payer: Self-pay | Admitting: Gastroenterology

## 2022-05-20 ENCOUNTER — Ambulatory Visit: Payer: PPO | Admitting: Gastroenterology

## 2022-05-20 VITALS — BP 120/68 | HR 72 | Ht 60.0 in | Wt 114.0 lb

## 2022-05-20 DIAGNOSIS — Z860100 Personal history of colon polyps, unspecified: Secondary | ICD-10-CM

## 2022-05-20 DIAGNOSIS — K5730 Diverticulosis of large intestine without perforation or abscess without bleeding: Secondary | ICD-10-CM

## 2022-05-20 DIAGNOSIS — K649 Unspecified hemorrhoids: Secondary | ICD-10-CM

## 2022-05-20 DIAGNOSIS — R1310 Dysphagia, unspecified: Secondary | ICD-10-CM | POA: Diagnosis not present

## 2022-05-20 DIAGNOSIS — Z8711 Personal history of peptic ulcer disease: Secondary | ICD-10-CM

## 2022-05-20 NOTE — Progress Notes (Signed)
HPI :  77 year old female here for a follow-up visit regarding history of hemorrhoids, dysphagia, history of gastric ulcer, diverticulosis, history of colon polyps.  She was last seen in March 2022.  Recall she had an EGD for symptoms of dysphagia.  This has been ongoing for some time.  Recall normal barium swallow in 2016 with a modified barium swallow in 2016 as well.  Prominent osteophytes in the C-spine with questionable extrinsic compression.  EGD in 2019 with a 3 cm hiatal hernia and no obvious stenosis.  She had a repeat EGD in March 2022, 5 cm hiatal hernia noted, mild distal esophageal stricture, dilated with savory dilation given her symptoms are more so oropharyngeal however dilation effect noted at the stricture.  She incidentally had some small/diminutive gastric ulcers.  She tested negative for H. pylori.  She was using Naprosyn routinely at the time for joint pains.  She states the dilation helped about 6 months and really improved her swallowing, unfortunately after about 6 months her symptoms have slowly recurred.  She states it is not too bothersome right now, she tries to avoid trigger foods and chews her food well and tries to eat slowly to minimize her symptoms.  She does not have any reflux symptoms that bother her.  Currently not on any PPI.  She has stopped all routine use of NSAIDs.  Denies any abdominal pains.  Generally is feeling well.  We discussed if she wanted to pursue another endoscopy or not.  She has had a history of hemorrhoids causing symptoms in the past, namely prolapse and bleeding.  She had RP and RA hemorrhoids banded in 2017 and she states that resolved her symptoms at the time.  She really has not had much of any hemorrhoid symptoms until about 1 time earlier this year she had some prolapse with a spot of blood on the toilet paper.  She states she had only 1 days worth of symptoms and no recurrence since then.  She states she moves her bowels well without any  issues.  Eats a lot of fruit and a daily magnesium supplement which keeps her moving her bowels well.  Of note recall she had a colonoscopy with me in 2022 it as well, has severe diverticular change in the left colon with luminal narrowing.  Had to use an upper endoscope to traverse this area.  A few diminutive polyps noted but nothing high risk.  She denies any recurrent diverticulitis or abdominal pain since of last seen her.  We discussed if she wanted to have any future colonoscopy exams.      Prior endoscopic evaluation:  Colonoscopy 02/26/2015 - There was severe diverticulosis in the left colon and mild diverticulosis in the right colon. The colon was very angulated and the diverticulosis made for a difficult intubation. A diminutive sessile cecal polyp was noted and removed with cold forceps. A 7mm and 4mm sessile ascending colon polyp was noted and removed via cold snare. A semipedunculated roughly 5-33mm polyp was noted at an angulated turn in the sigmoid colon and removed via hot snare. The remainder of the colon was normal. Retroflexed views revealed internal hemorrhoids. Path c/w one adenoma, rest sessile serrated   EGD 08/25/2017 -  - A 3 cm hiatal hernia was present. - The exam of the esophagus was otherwise normal. - The entire examined stomach was normal. - Biopsies were taken with a cold forceps in the gastric body, at the incisura and in the gastric antrum for  Helicobacter pylori testing. - The duodenal bulb and second portion of the duodenum were normal.  EGD 04/12/20: - Esophagogastric landmarks were identified: the Z-line was found at 30 cm, the gastroesophageal junction was found at 30 cm and the upper extent of the gastric folds was found at 35 cm from the incisors. Findings: - A 5 cm hiatal hernia was present. - LA Grade A esophagitis was found 30 cm from the incisors. - One benign-appearing, intrinsic mild stenosis was found 30 cm from the incisors. This stenosis measured less  than one cm (in length). A guidewire was placed and the scope was withdrawn. Dilation was performed with a Savary dilator with mild resistance at 17 mm (as opposed to a balloon in case proximal subtle stenosis related to symptoms). Relook endoscopy showed appropriate mucosal wrent at the stricture - The exam of the esophagus was otherwise normal. - Three cratered gastric ulcers were found in the gastric antrum. The largest lesion was 3 mm in largest dimension. Biopsies were taken with a cold forceps for histology. - The exam of the stomach was otherwise normal. - Biopsies were taken with a cold forceps in the gastric body, at the incisura and in the gastric antrum for Helicobacter pylori testing. - The duodenal bulb and second portion of the duodenum were normal.  Colonoscopy 04/12/20: - The perianal and digital rectal examinations were normal. - Two sessile polyps were found in the cecum. The polyps were diminutive in size. These polyps were removed with a cold snare. Resection and retrieval were complete. - A diminutive polyp was found in the ascending colon. The polyp was sessile. The polyp was removed with a cold snare. Resection and retrieval were complete. - Multiple medium-mouthed diverticula were found in the entire colon, highest burden in the left colon where there was restricted mobility of the colon. A pediatric colonoscope could not traverse the area due to luminal narrowing. An upper endoscope was used to traverse the sigmoid colon and achieve cecal intubation, which prolonged the exam. - Internal hemorrhoids were found. - The exam was otherwise without abnormality.  1. Surgical [P], gastric ulcer bx - GASTRIC ANTRAL MUCOSA WITH NONSPECIFIC REACTIVE GASTROPATHY - NEGATIVE FOR INTESTINAL METAPLASIA, DYSPLASIA OR MALIGNANCY - WARTHIN STARRY STAIN IS NEGATIVE FOR HELICOBACTER PYLORI 2. Surgical [P], gastric bx - GASTRIC ANTRAL MUCOSA WITH MILD NONSPECIFIC REACTIVE GASTROPATHY - GASTRIC OXYNTIC  MUCOSA WITH NO SPECIFIC HISTOPATHOLOGIC CHANGES - WARTHIN STARRY STAIN IS NEGATIVE FOR HELICOBACTER PYLORI 3. Surgical [P], colon, cecum, ascending, polyp (3) - TUBULAR ADENOMA(S) - NEGATIVE FOR HIGH-GRADE DYSPLASIA OR MALIGNANCY   Past Medical History:  Diagnosis Date   Breast cancer    Cataract    right cateract removed   Diverticulosis    Dysphagia    Gastritis    GERD (gastroesophageal reflux disease)    History of colon polyps    Hyperlipidemia    Internal hemorrhoids    Osteoporosis      Past Surgical History:  Procedure Laterality Date   BELPHAROPTOSIS REPAIR     BREAST LUMPECTOMY WITH RADIOACTIVE SEED LOCALIZATION Left 01/20/2022   Procedure: LEFT BREAST LUMPECTOMY WITH RADIOACTIVE SEED LOCALIZATION;  Surgeon: Emelia Loron, MD;  Location: [facility];  Service: General;  Laterality: Left;  60 MIN ROOM 8   CATARACT EXTRACTION  04/2013   CESAREAN SECTION  1977/1983   x3   FOOT SURGERY Right 02/2012   HEMORRHOID BANDING     skin cancer removal     face  Family History  Problem Relation Age of Onset   Heart failure Mother    Breast cancer Mother 60   Cancer Father    Breast cancer Cousin 53       maternal female cousin   Colon cancer Neg Hx    Esophageal cancer Neg Hx    Rectal cancer Neg Hx    Stomach cancer Neg Hx    Social History   Tobacco Use   Smoking status: Former    Packs/day: 1.00    Years: 1.50    Additional pack years: 0.00    Total pack years: 1.50    Types: Cigarettes    Quit date: 1970    Years since quitting: 54.3   Smokeless tobacco: Never  Vaping Use   Vaping Use: Never used  Substance Use Topics   Alcohol use: Yes    Alcohol/week: 7.0 standard drinks of alcohol    Types: 7 Glasses of wine per week    Comment: 5-7 glasses red wine per week per patient.   Drug use: No   Current Outpatient Medications  Medication Sig Dispense Refill   acetaminophen (TYLENOL) 650 MG CR tablet Take 1,300 mg by mouth at  bedtime.     alendronate (FOSAMAX) 70 MG tablet Take 70 mg by mouth once a week.     ascorbic acid (VITAMIN C) 500 MG tablet Take 500 mg by mouth daily.     BIOTIN PO Take 1 tablet by mouth daily.     Calcium Carbonate-Vitamin D (CALCIUM-D PO) Take 1 tablet by mouth daily.     Coenzyme Q10 (COQ10 PO) Take 1 capsule by mouth daily.     conjugated estrogens (PREMARIN) vaginal cream Place 1 Applicatorful vaginally as needed (dryness).     Multiple Vitamin (MULTIVITAMIN WITH MINERALS) TABS tablet Take 1 tablet by mouth daily.     rosuvastatin (CRESTOR) 10 MG tablet Take 1 tablet by mouth once daily (Patient taking differently: Take 10 mg by mouth at bedtime.) 90 tablet 0   tamoxifen (NOLVADEX) 10 MG tablet Take 1 tablet (10 mg total) by mouth daily. 90 tablet 3   No current facility-administered medications for this visit.   Allergies  Allergen Reactions   Covid-19 Mrna Vacc (Moderna) Swelling    Reaction to booster shot 12/19/2019 - severe swelling/arthritis/joint pain/raised bumps - no reaction to 1st 2 shots     Review of Systems: All systems reviewed and negative except where noted in HPI.   Lab Results  Component Value Date   WBC 4.7 01/08/2022   HGB 13.3 01/08/2022   HCT 40.3 01/08/2022   MCV 93.5 01/08/2022   PLT 193 01/08/2022    Lab Results  Component Value Date   CREATININE 0.85 01/08/2022   BUN 14 01/08/2022   NA 140 01/08/2022   K 4.2 01/08/2022   CL 104 01/08/2022   CO2 32 01/08/2022    Lab Results  Component Value Date   ALT 13 01/08/2022   AST 21 01/08/2022   ALKPHOS 49 01/08/2022   BILITOT 0.5 01/08/2022     Physical Exam: BP 120/68 (BP Location: Left Arm, Patient Position: Sitting, Cuff Size: Normal)   Pulse 72   Ht 5' (1.524 m)   Wt 114 lb (51.7 kg)   SpO2 99%   BMI 22.26 kg/m  Constitutional: Pleasant,well-developed, female in no acute distress. Neurological: Alert and oriented to person place and time. Psychiatric: Normal mood and affect.  Behavior is normal.   ASSESSMENT: 77 y.o. female  here for assessment of the following  1. Hemorrhoids, unspecified hemorrhoid type   2. Dysphagia, unspecified type   3. History of gastric ulcer   4. History of colon polyps   5. Diverticulosis of colon without hemorrhage    Had more routine symptoms of prolapse which led to hemorrhoid banding in 2017, this resolved her issues for the most part.  Had 1 day worth of symptomatic hemorrhoids with prolapse and scant bleeding several weeks ago, has not recurred.  Moving her bowels well without any constipation.  She inquires about banding again, I do not think she needs banding given only 1 day of symptoms that had bothered her.  She will be mindful of symptoms and if they bother her more frequently we can certainly offer another banding.  For now would continue to manage her bowels with dietary measures and her magnesium supplement if that is working well to prevent constipation.  We did discuss she has a history of colon polyps, normally would consider another colonoscopy 5 years from her last exam, she will be age 75 at that time.  Given the polyps are small, I think the risks of future colonoscopy outweigh the benefits given she has severe diverticular changes with stricturing, at high risk for complications, especially that that age.  I recommend against future surveillance colonoscopy for routine purposes in this light and she is agreeable to that.  If she has recurrent symptoms that bother her she will contact me, otherwise make sure she avoids constipation, can use MiraLAX if needed.  Reviewed her history of dysphagia and gastric ulcers.  Her ulcers were diminutive and due to NSAIDs at the time, she is avoiding NSAIDs and I do not think we need to follow-up an upper endoscopy at this point in time given she has no symptoms and ulcers were diminutive.  In regards to her dysphagia, dilation did help but her symptoms have since recurred.  I offered her  another endoscopy with dilation depending on how much the symptoms bother her.  Hard to say exactly how long the benefit would last, we discussed risks and benefits of endoscopy and anesthesia.  At this point time she states her symptoms are stable and wishes to monitor for now, if she has more concerning symptoms or progressive dysphagia she can contact me and would book her at the [facility] for EGD directly if needed.  All questions answered.   PLAN: - mild hemorrhoid symptoms had only one day of recurrence nothing since then. Counseled on hemorrhoids, can do banding if symptoms recur and more frequent but would monitor for now, colonoscopy UTD - history of esophageal stricture, dilation helped for 6 months but then recurred. Managing with eating slowly / small bites. Offered dilation again, she declines for now but can follow up PRN if it recurs - prior ulcers small, due to NSAIDs, H pylori negative. She declines surveillance EGD at this time - small polyps, normally consider repeat in 5 years, will be age 76. Severe diverticulosis with luminal narrowing, at higher risk for complications would not recommend further elective surveillance colonoscopy, feel risks > benefits   Harlin Rain, MD [facility]

## 2022-05-20 NOTE — Patient Instructions (Addendum)
If your blood pressure at your visit was 140/90 or greater, please contact your primary care physician to follow up on this.  _______________________________________________________  If you are age 76 or older, your body mass index should be between 23-30. Your Body mass index is 22.26 kg/m. If this is out of the aforementioned range listed, please consider follow up with your Primary Care Provider.  If you are age 60 or younger, your body mass index should be between 19-25. Your Body mass index is 22.26 kg/m. If this is out of the aformentioned range listed, please consider follow up with your Primary Care Provider.   ________________________________________________________  The [facility] providers would like to encourage you to use [facility] to communicate with providers for non-urgent requests or questions.  Due to long hold times on the telephone, sending your provider a message by [facility] may be a faster and more efficient way to get a response.  Please allow 48 business hours for a response.  Please remember that this is for non-urgent requests.  _______________________________________________________  Due to recent changes in healthcare laws, you may see the results of your imaging and laboratory studies on MyChart before your provider has had a chance to review them.  We understand that in some cases there may be results that are confusing or concerning to you. Not all laboratory results come back in the same time frame and the provider may be waiting for multiple results in order to interpret others.  Please give [place] 48 hours in order for your provider to thoroughly review all the results before contacting the office for clarification of your results.    Thank you for entrusting me with your care and for choosing [facility], Dr. Ileene Patrick

## 2022-05-23 DIAGNOSIS — H11002 Unspecified pterygium of left eye: Secondary | ICD-10-CM | POA: Diagnosis not present

## 2022-05-23 DIAGNOSIS — Z961 Presence of intraocular lens: Secondary | ICD-10-CM | POA: Diagnosis not present

## 2022-05-26 ENCOUNTER — Ambulatory Visit: Payer: PPO

## 2022-05-26 DIAGNOSIS — I700 Atherosclerosis of aorta: Secondary | ICD-10-CM

## 2022-05-26 DIAGNOSIS — R072 Precordial pain: Secondary | ICD-10-CM | POA: Diagnosis not present

## 2022-05-28 IMAGING — US US ABDOMEN LIMITED
1 series · 14 of 18 positions shown · non-contrast
Comparison: None.

CLINICAL DATA: Abdominal wall masses

EXAM:
ULTRASOUND ABDOMEN LIMITED

[Series 1: us abdomen limited · 0.08mm/px · 18 acquisitions, 14 frames shown]
[im 1/18]
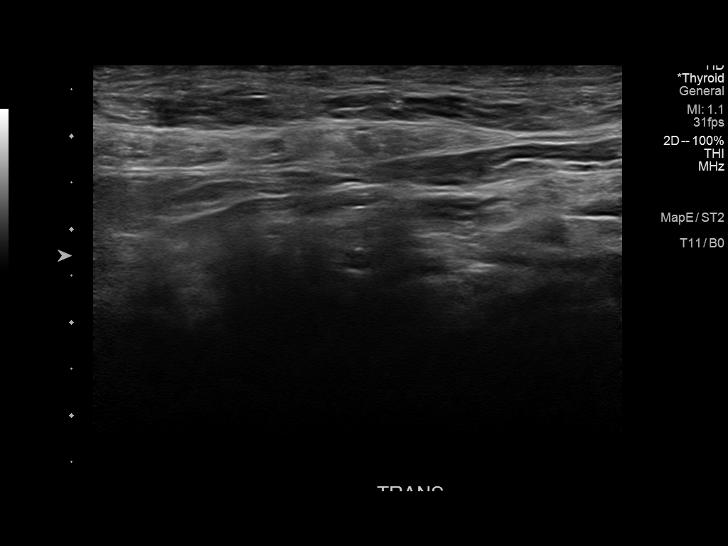
[im 2/18]
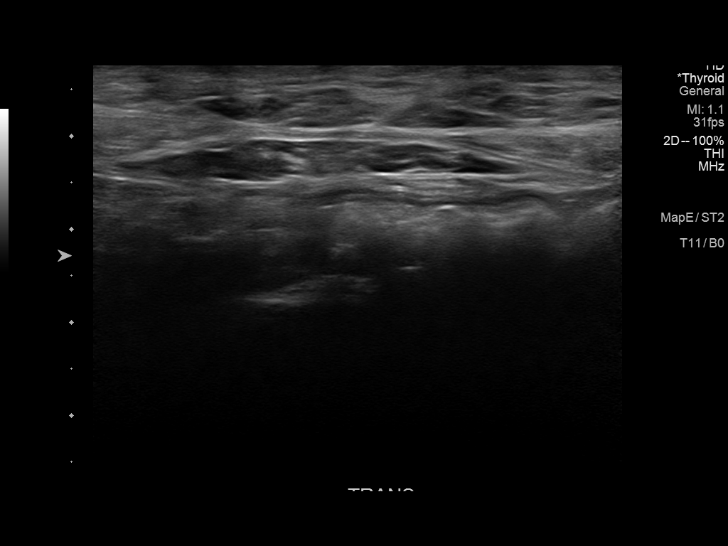
[im 4/18]
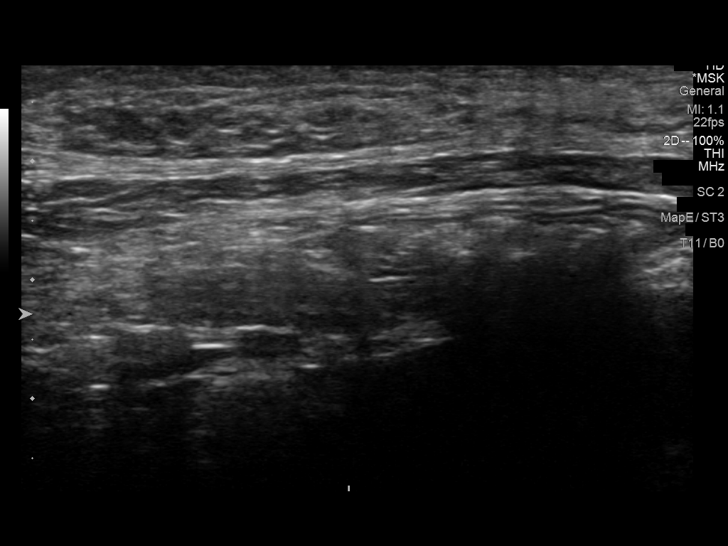
[im 5/18]
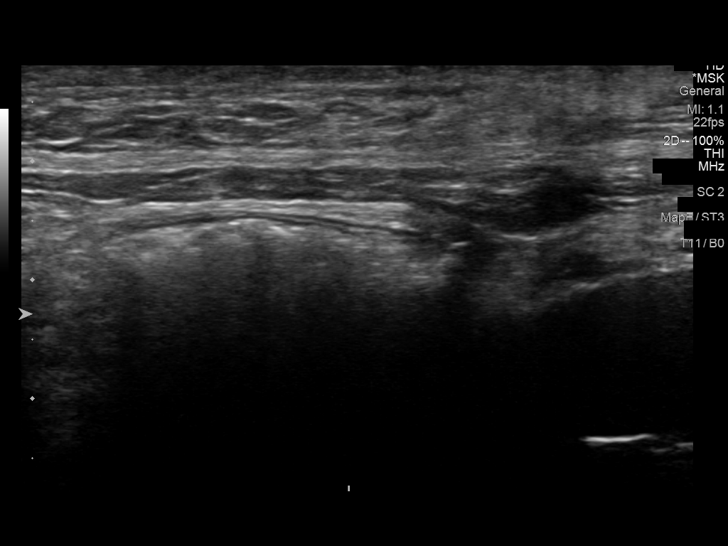
[im 6/18]
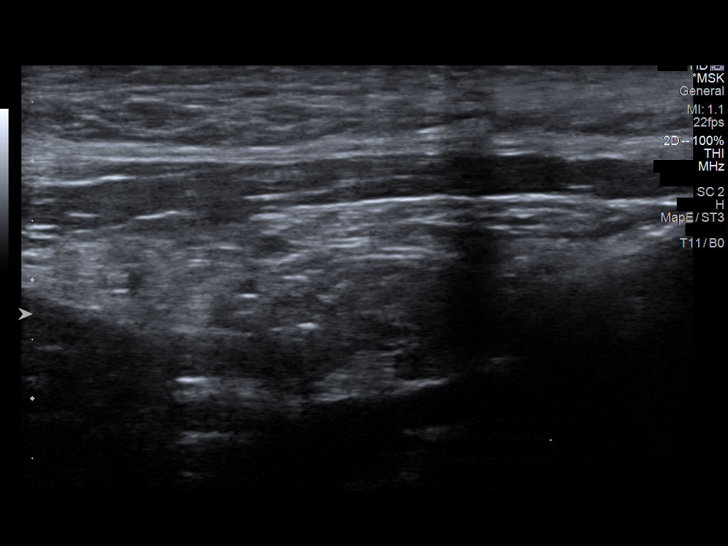
[im 8/18]
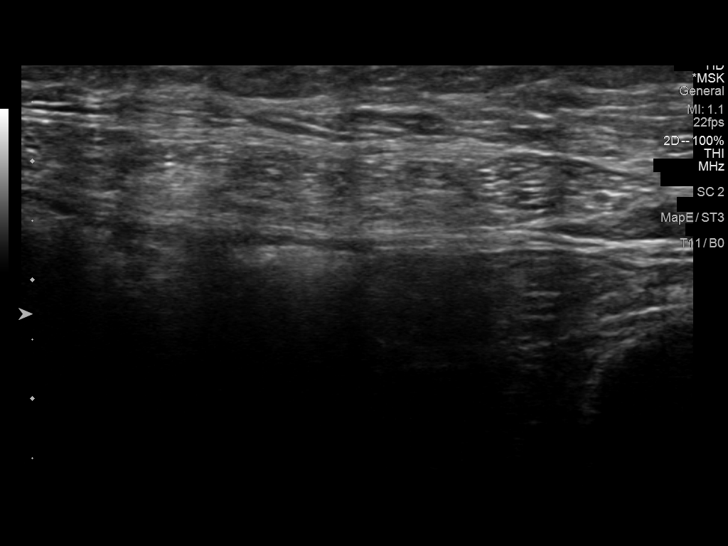
[im 9/18]
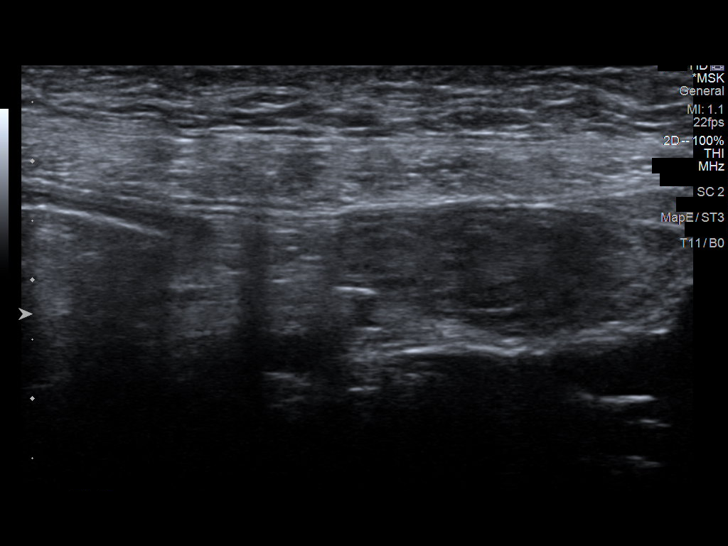
[im 10/18]
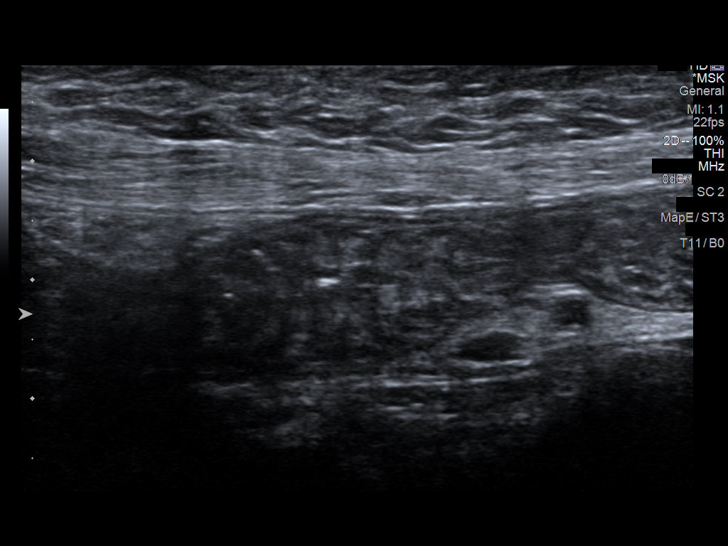
[im 11/18]
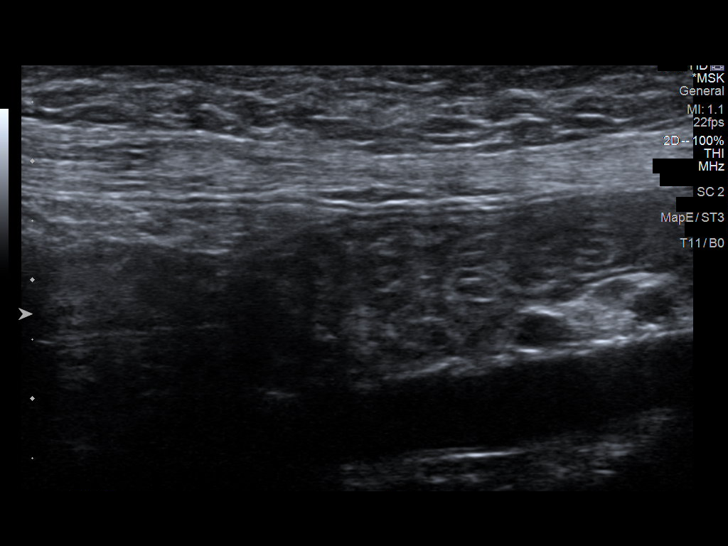
[im 13/18]
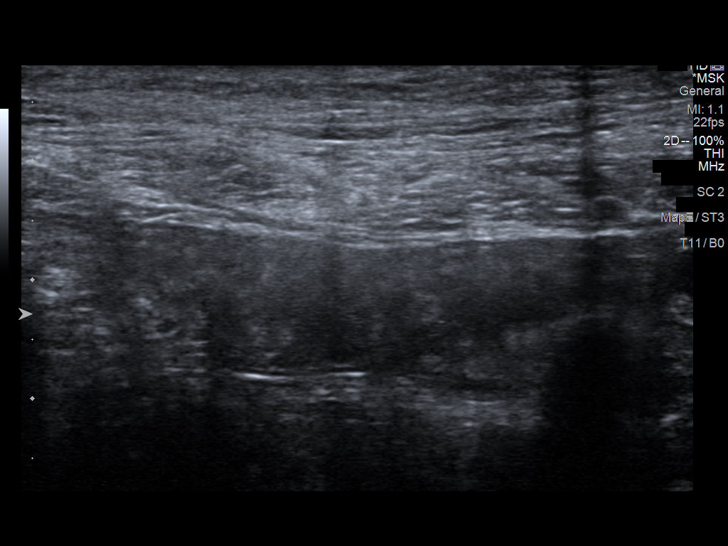
[im 14/18]
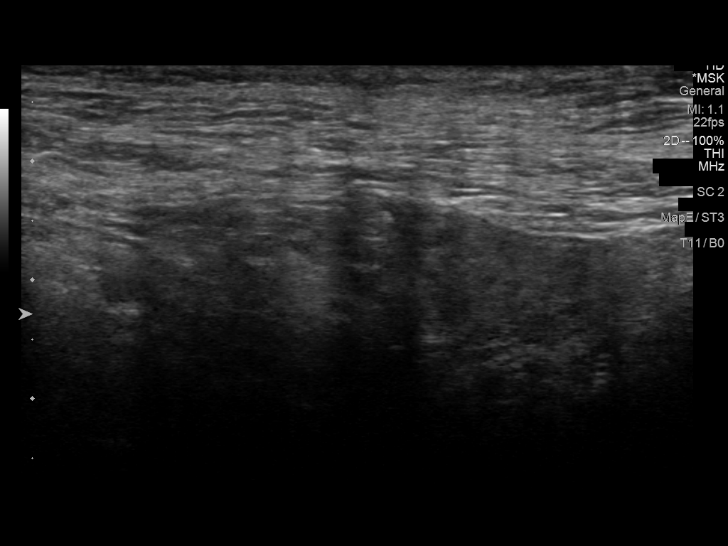
[im 15/18]
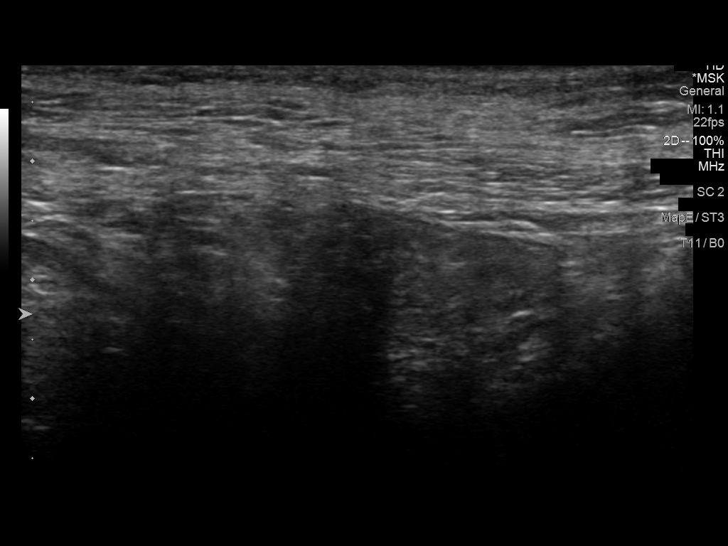
[im 17/18]
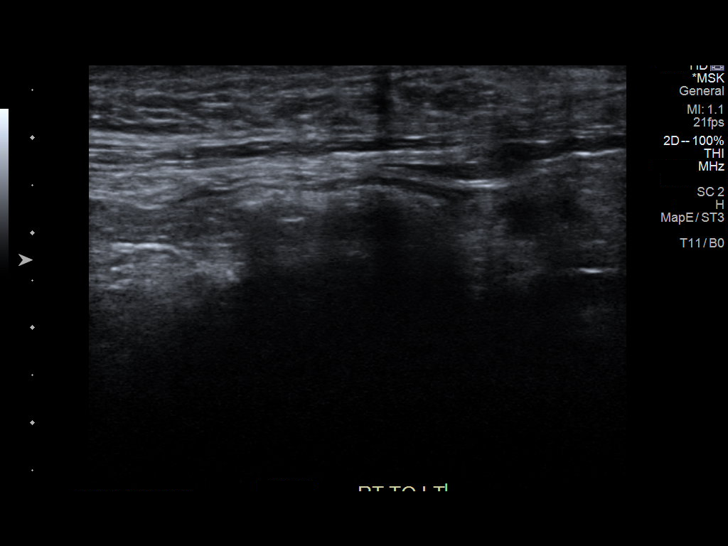
[im 18/18]
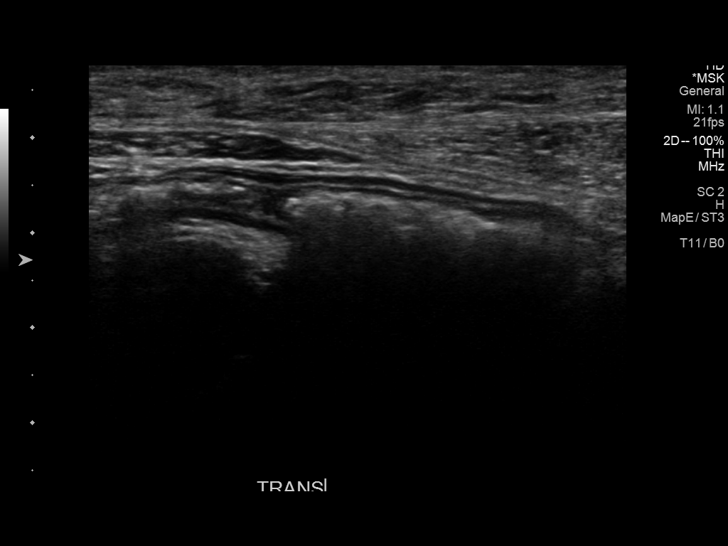

[14 of 18 positions shown; findings below may reference images not displayed]

FINDINGS: Targeted ultrasound of the multiple palpable masses described by the
patient is performed. These are described as epigastric, right lower
quadrant and paraumbilical. No cysts or solid mass is seen. There is
no bowel containing hernia.
IMPRESSION: Negative. No sonographic correlate for further management will need
to be based on clinical grounds multiple multiple palpable abdominal
wall masses. Further management will need to be based on clinical
grounds.

## 2022-05-28 IMAGING — US US AORTA
1 series · 14 of 19 positions shown · non-contrast
Comparison: None.

CLINICAL DATA: Lump in epigastric region.

EXAM:
ULTRASOUND OF ABDOMINAL AORTA
TECHNIQUE: Ultrasound examination of the abdominal aorta and proximal common
iliac arteries was performed to evaluate for aneurysm. Additional
color and Doppler images of the distal aorta were obtained to
document patency.

[Series 1: us aorta · 0.20mm/px · 14 of 19 slices shown]
[im 1/19]
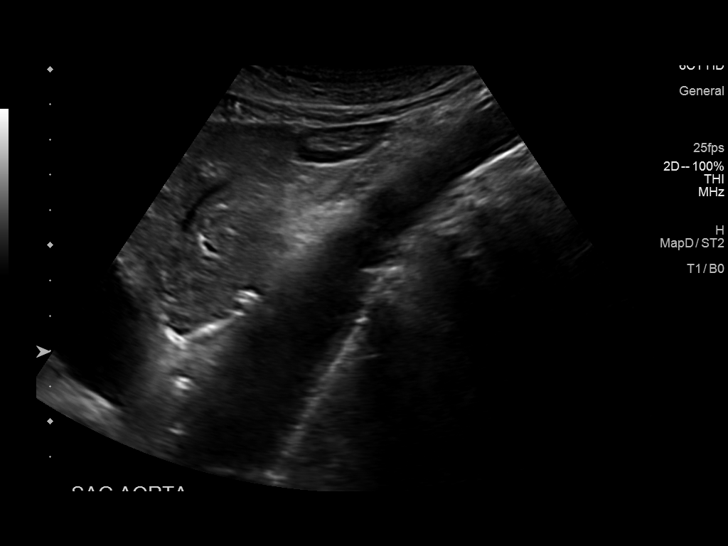
[im 3/19]
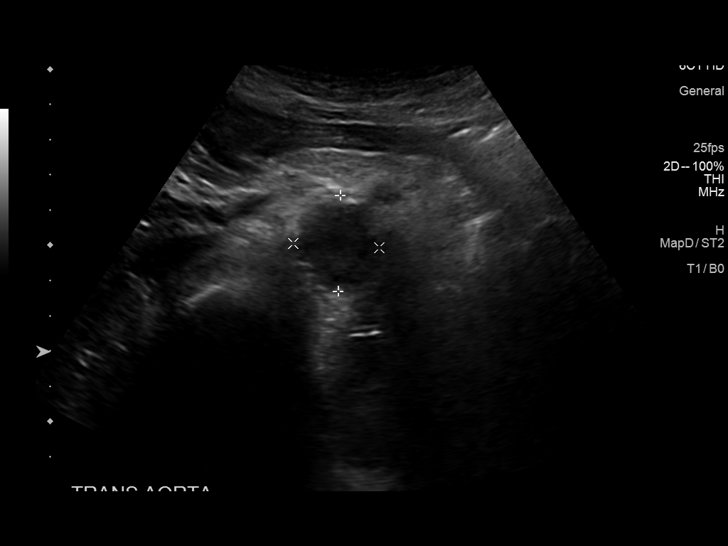
[im 4/19]
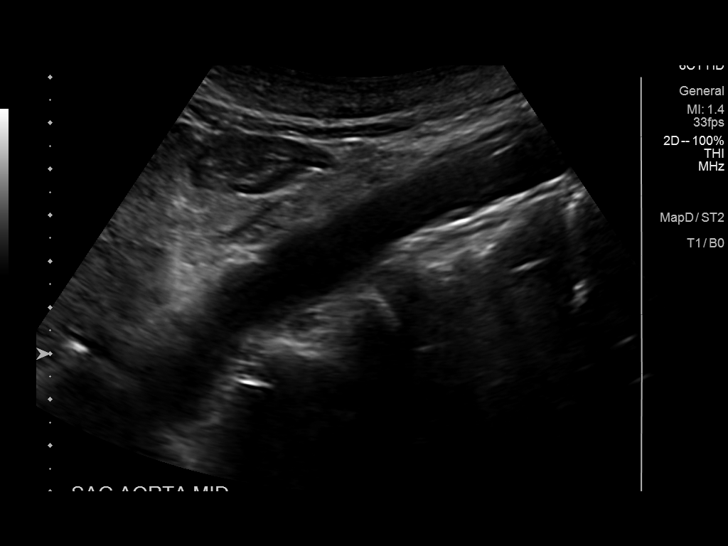
[im 5/19]
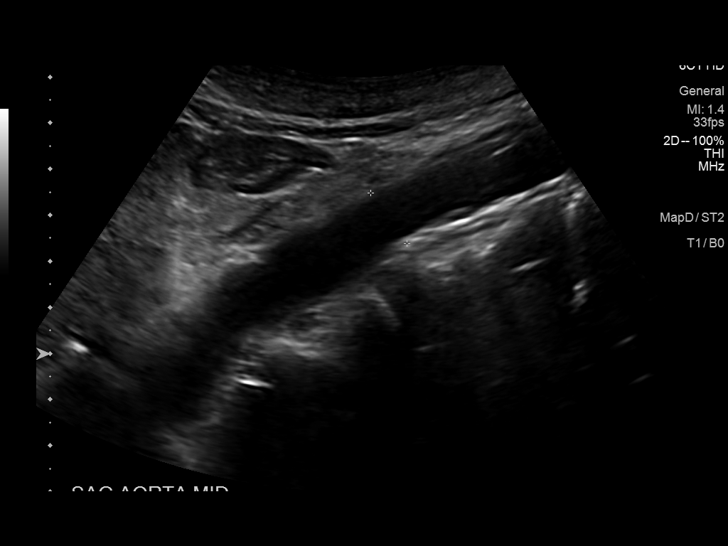
[im 7/19]
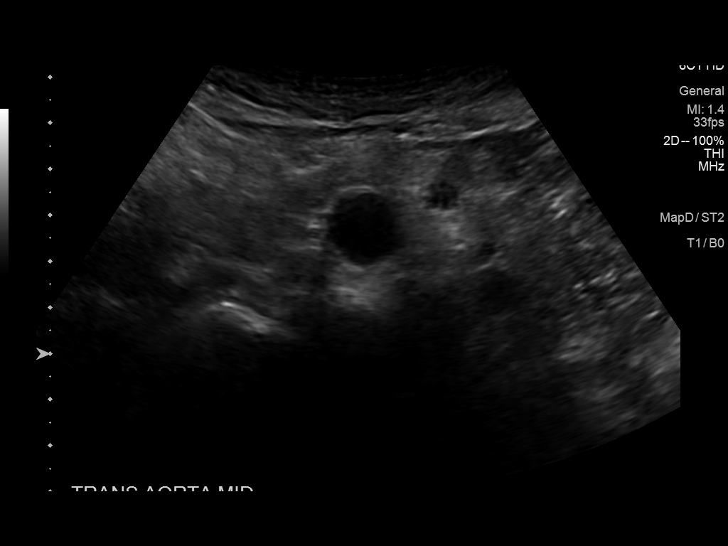
[im 8/19]
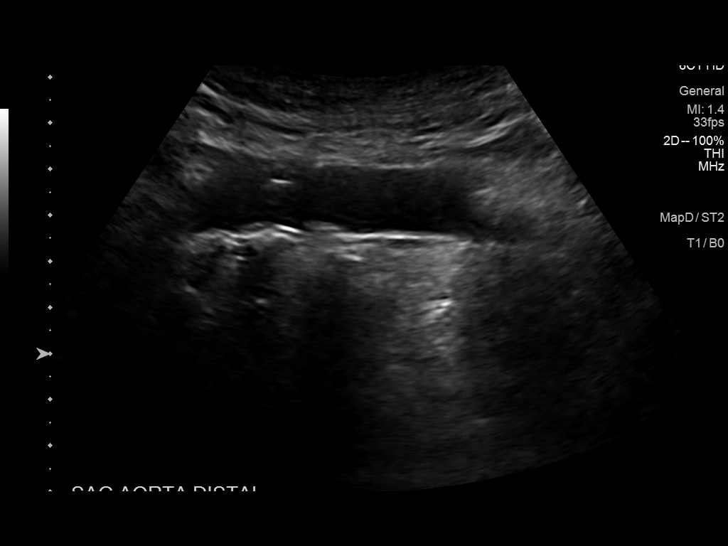
[im 9/19]
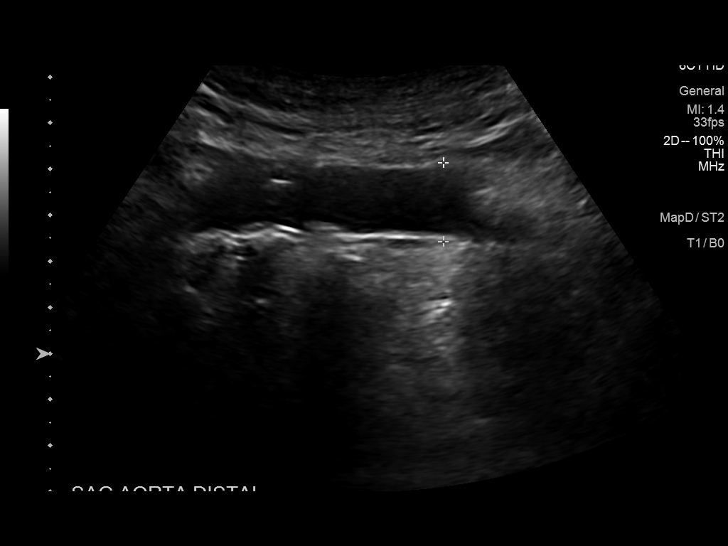
[im 11/19]
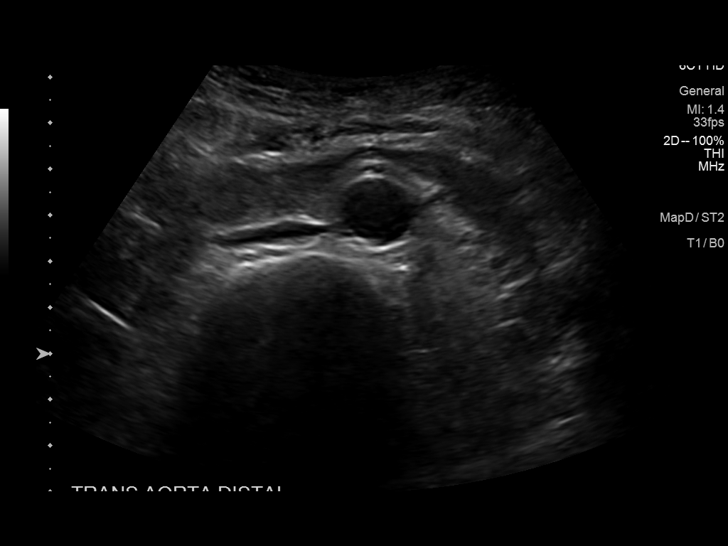
[im 12/19]
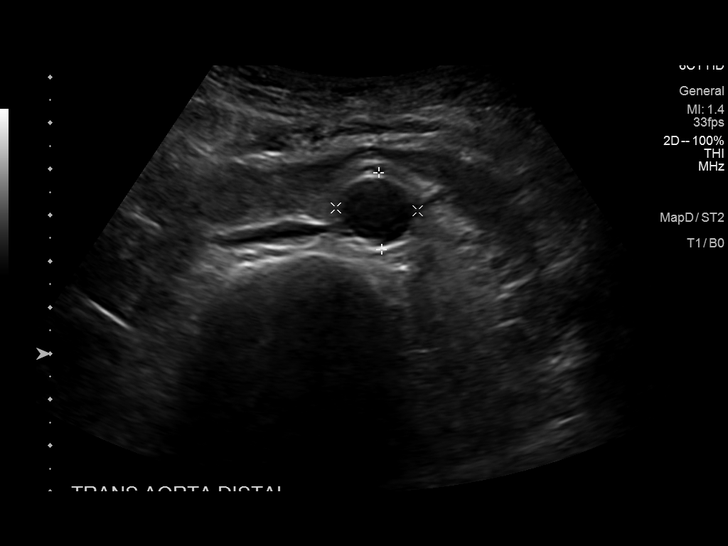
[im 13/19]
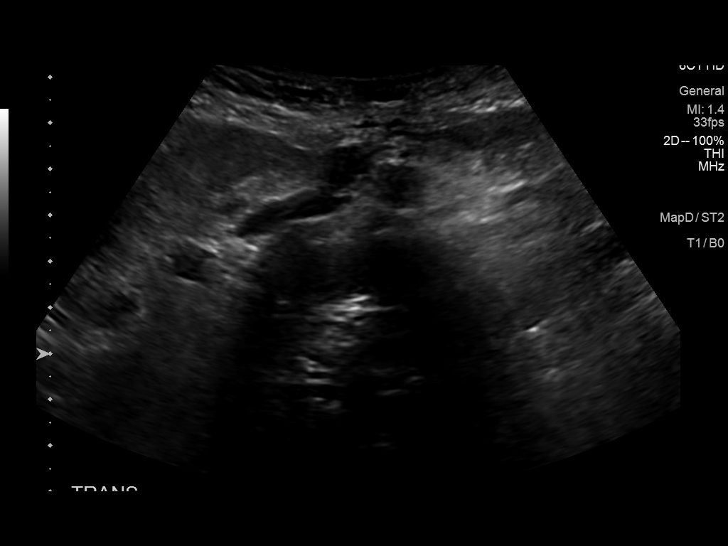
[im 15/19]
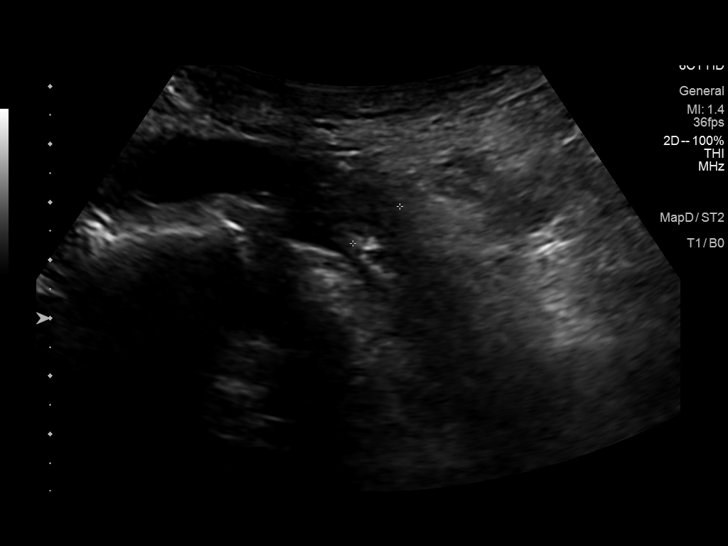
[im 16/19]
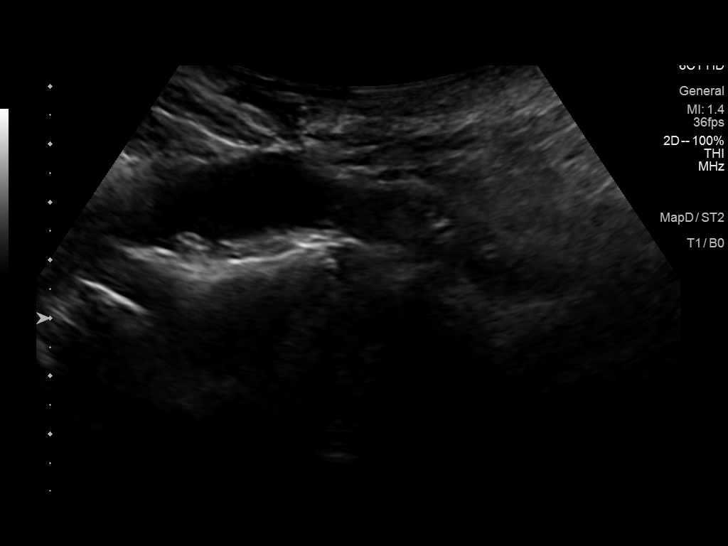
[im 17/19]
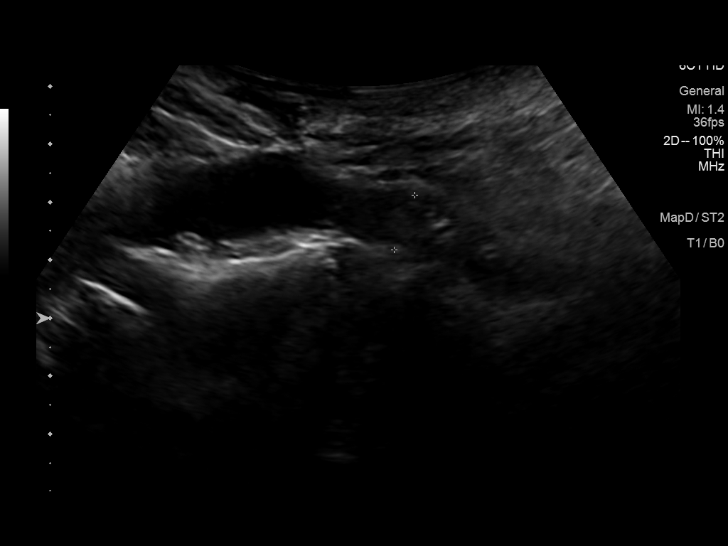
[im 19/19]
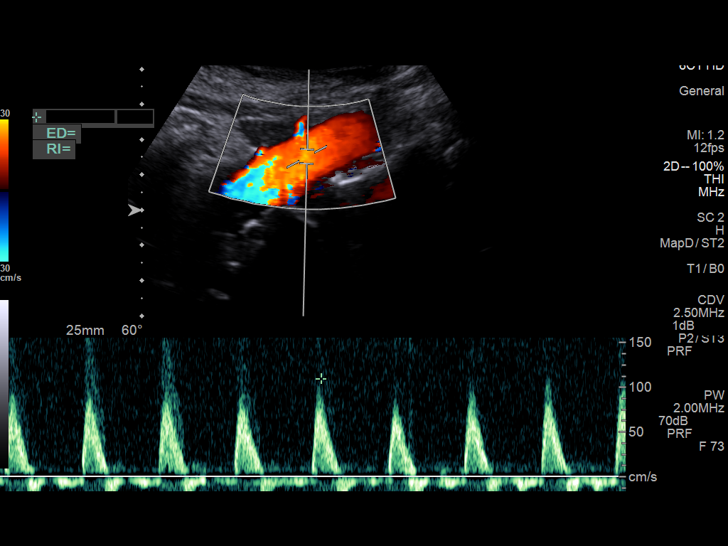

[14 of 19 positions shown; findings below may reference images not displayed]

FINDINGS: Abdominal aortic measurements as follows:

Proximal:  2.5 x 2.4 cm

Mid:  1.4 x 1.9 cm

Distal:  1.7 x 1.8 cm
Patent: Yes, peak systolic velocity is 110 cm/s

Right common iliac artery: 1.1 x 1.1 cm

Left common iliac artery: 1.1 x 1.2 cm
IMPRESSION: No evidence for abdominal aortic aneurysm.

## 2022-06-18 ENCOUNTER — Inpatient Hospital Stay: Payer: PPO | Attending: Adult Health | Admitting: Adult Health

## 2022-06-18 ENCOUNTER — Encounter: Payer: Self-pay | Admitting: Adult Health

## 2022-06-18 ENCOUNTER — Telehealth: Payer: Self-pay | Admitting: *Deleted

## 2022-06-18 VITALS — BP 135/62 | HR 77 | Temp 98.4°F | Wt 119.6 lb

## 2022-06-18 DIAGNOSIS — Z803 Family history of malignant neoplasm of breast: Secondary | ICD-10-CM | POA: Diagnosis not present

## 2022-06-18 DIAGNOSIS — Z87891 Personal history of nicotine dependence: Secondary | ICD-10-CM | POA: Insufficient documentation

## 2022-06-18 DIAGNOSIS — D0512 Intraductal carcinoma in situ of left breast: Secondary | ICD-10-CM | POA: Insufficient documentation

## 2022-06-18 DIAGNOSIS — Z79810 Long term (current) use of selective estrogen receptor modulators (SERMs): Secondary | ICD-10-CM | POA: Insufficient documentation

## 2022-06-18 NOTE — Progress Notes (Signed)
SURVIVORSHIP VISIT:  BRIEF ONCOLOGIC HISTORY:  Oncology History  Ductal carcinoma in situ (DCIS) of left breast  12/23/2021 Initial Diagnosis   Screening detected left breast calcifications upper medial quadrant 0.8 cm, axilla negative, biopsy: Intermediate grade DCIS ER 95%, PR 50%   01/08/2022 Cancer Staging   Staging form: Breast, AJCC 8th Edition - Clinical: Stage 0 (cTis (DCIS), cN0, cM0, G2, ER+, PR+, HER2: Not Assessed) - Signed by Serena Croissant, MD on 01/08/2022 Stage prefix: Initial diagnosis Histologic grading system: 3 grade system   01/15/2022 Genetic Testing   Negative hereditary cancer genetic testing: no pathogenic variants detected in [facility] Common Hereditary Cancers +RNA Panel.  Report date is 01/15/2022.    The [facility] Common Hereditary Cancers + RNA Panel includes sequencing, deletion/duplication, and RNA analysis of the following 48 genes: APC, ATM, AXIN2, BAP1, BARD1, BMPR1A, BRCA1, BRCA2, BRIP1, CDH1, CDK4*, CDKN2A*, CHEK2, CTNNA1, DICER1, EPCAM* (del/dup only), FH, GREM1* (promoter dup analysis only), HOXB13*, KIT*, MBD4*, MEN1, MLH1, MSH2, MSH3, MSH6, MUTYH, NF1, NTHL1, PALB2, PDGFRA*, PMS2, POLD1, POLE, PTEN, RAD51C, RAD51D, SDHA (sequencing only), SDHB, SDHC, SDHD, SMAD4, SMARCA4, STK11, TP53, TSC1, TSC2, VHL.  *Genes without RNA analysis.    01/20/2022 Surgery   Left lumpectomy: 1 cm intermediate grade DCIS, no invasive cancer, margins negative, ER 95%, PR 50%    02/2022 -  Anti-estrogen oral therapy   Tamoxifen daily   06/18/2022 Cancer Staging   Staging form: Breast, AJCC 8th Edition - Pathologic: Stage 0 (pTis (DCIS), pN0, cM0, ER+, PR+) - Signed by Loa Socks, NP on 06/18/2022 Nuclear grade: G2     INTERVAL HISTORY:  Melissa Frey to review her survivorship care plan detailing her treatment course for breast cancer, as well as monitoring long-term side effects of that treatment, education regarding health maintenance, screening, and overall  wellness and health promotion.     Overall, Melissa Frey reports feeling quite well.  She is taking Tamoxifen daily with good tolerance.  She has mild vaginal discharge, but otherwise tolerates it well and without difficulty.  She is doing well and has no concerns today.   REVIEW OF SYSTEMS:  Review of Systems  Constitutional:  Negative for appetite change, chills, fatigue, fever and unexpected weight change.  HENT:   Negative for hearing loss, lump/mass and trouble swallowing.   Eyes:  Negative for eye problems and icterus.  Respiratory:  Negative for chest tightness, cough and shortness of breath.   Cardiovascular:  Negative for chest pain, leg swelling and palpitations.  Gastrointestinal:  Negative for abdominal distention, abdominal pain, constipation, diarrhea, nausea and vomiting.  Endocrine: Negative for hot flashes.  Genitourinary:  Negative for difficulty urinating.   Musculoskeletal:  Negative for arthralgias.  Skin:  Negative for itching and rash.  Neurological:  Negative for dizziness, extremity weakness, headaches and numbness.  Hematological:  Negative for adenopathy. Does not bruise/bleed easily.  Psychiatric/Behavioral:  Negative for depression. The patient is not nervous/anxious.    Breast: Denies any new nodularity, masses, tenderness, nipple changes, or nipple discharge.       PAST MEDICAL/SURGICAL HISTORY:  Past Medical History:  Diagnosis Date   Breast cancer (HCC)    Cataract    right cateract removed   Diverticulosis    Dysphagia    Gastritis    GERD (gastroesophageal reflux disease)    History of colon polyps    Hyperlipidemia    Internal hemorrhoids    Osteoporosis    Past Surgical History:  Procedure Laterality Date   BELPHAROPTOSIS REPAIR  BREAST LUMPECTOMY WITH RADIOACTIVE SEED LOCALIZATION Left 01/20/2022   Procedure: LEFT BREAST LUMPECTOMY WITH RADIOACTIVE SEED LOCALIZATION;  Surgeon: Emelia Loron, MD;  Location: [facility];  Service: General;  Laterality: Left;  60 MIN ROOM 8   CATARACT EXTRACTION  04/2013   CESAREAN SECTION  1977/1983   x3   FOOT SURGERY Right 02/2012   HEMORRHOID BANDING     skin cancer removal     face     ALLERGIES:  Allergies  Allergen Reactions   Covid-19 Mrna Vacc (Moderna) Swelling    Reaction to booster shot 12/19/2019 - severe swelling/arthritis/joint pain/raised bumps - no reaction to 1st 2 shots     CURRENT MEDICATIONS:  Outpatient Encounter Medications as of 06/18/2022  Medication Sig   acetaminophen (TYLENOL) 650 MG CR tablet Take 1,300 mg by mouth at bedtime.   alendronate (FOSAMAX) 70 MG tablet Take 70 mg by mouth once a week.   ascorbic acid (VITAMIN C) 500 MG tablet Take 500 mg by mouth daily.   BIOTIN PO Take 1 tablet by mouth daily.   Calcium Carbonate-Vitamin D (CALCIUM-D PO) Take 1 tablet by mouth daily.   Coenzyme Q10 (COQ10 PO) Take 1 capsule by mouth daily.   conjugated estrogens (PREMARIN) vaginal cream Place 1 Applicatorful vaginally as needed (dryness).   Multiple Vitamin (MULTIVITAMIN WITH MINERALS) TABS tablet Take 1 tablet by mouth daily.   rosuvastatin (CRESTOR) 10 MG tablet Take 1 tablet by mouth once daily (Patient taking differently: Take 10 mg by mouth at bedtime.)   tamoxifen (NOLVADEX) 10 MG tablet Take 1 tablet (10 mg total) by mouth daily.   No facility-administered encounter medications on file as of 06/18/2022.     ONCOLOGIC FAMILY HISTORY:  Family History  Problem Relation Age of Onset   Heart failure Mother    Breast cancer Mother 34   Cancer Father    Breast cancer Cousin 73       maternal female cousin   Colon cancer Neg Hx    Esophageal cancer Neg Hx    Rectal cancer Neg Hx    Stomach cancer Neg Hx      SOCIAL HISTORY:  Social History   Socioeconomic History   Marital status: Widowed    Spouse name: Not on file   Number of children: 2   Years of education: Not on file   Highest education level: Not on file   Occupational History   Not on file  Tobacco Use   Smoking status: Former    Packs/day: 1.00    Years: 1.50    Additional pack years: 0.00    Total pack years: 1.50    Types: Cigarettes    Quit date: 88    Years since quitting: 54.4   Smokeless tobacco: Never  Vaping Use   Vaping Use: Never used  Substance and Sexual Activity   Alcohol use: Yes    Alcohol/week: 7.0 standard drinks of alcohol    Types: 7 Glasses of wine per week    Comment: 5-7 glasses red wine per week per patient.   Drug use: No   Sexual activity: Yes  Other Topics Concern   Not on file  Social History Narrative   Not on file   Social Determinants of Health   Financial Resource Strain: Low Risk  (01/08/2022)   Overall Financial Resource Strain (CARDIA)    Difficulty of Paying Living Expenses: Not hard at all  Food Insecurity: No Food Insecurity (01/08/2022)  Hunger Vital Sign    Worried About Running Out of Food in the Last Year: Never true    Ran Out of Food in the Last Year: Never true  Transportation Needs: No Transportation Needs (01/08/2022)   PRAPARE - Administrator, Civil Service (Medical): No    Lack of Transportation (Non-Medical): No  Physical Activity: Not on file  Stress: Not on file  Social Connections: Not on file  Intimate Partner Violence: Not on file     OBSERVATIONS/OBJECTIVE:  BP 135/62 (BP Location: Left Arm, Patient Position: Sitting)   Pulse 77   Temp 98.4 F (36.9 C) (Oral)   Wt 119 lb 9.6 oz (54.3 kg)   SpO2 99%   BMI 23.36 kg/m  GENERAL: Patient is a well appearing female in no acute distress HEENT:  Sclerae anicteric.  Oropharynx clear and moist. No ulcerations or evidence of oropharyngeal candidiasis. Neck is supple.  NODES:  No cervical, supraclavicular, or axillary lymphadenopathy palpated.  BREAST EXAM:  left breast s/p lumpectomy, no sign of local recurrence, right breast benign.   LUNGS:  Clear to auscultation bilaterally.  No wheezes or  rhonchi. HEART:  Regular rate and rhythm. No murmur appreciated. ABDOMEN:  Soft, nontender.  Positive, normoactive bowel sounds. No organomegaly palpated. MSK:  No focal spinal tenderness to palpation. Full range of motion bilaterally in the upper extremities. EXTREMITIES:  No peripheral edema.   SKIN:  Clear with no obvious rashes or skin changes. No nail dyscrasia. NEURO:  Nonfocal. Well oriented.  Appropriate affect.   LABORATORY DATA:  None for this visit.  DIAGNOSTIC IMAGING:  None for this visit.      ASSESSMENT AND PLAN:  Melissa Frey is a pleasant 77 y.o. female with Stage 0 left breast DCIS,  ER+/PR+, diagnosed in 12/2021, treated with lumpectomy and anti-estrogen therapy with Tamoxifen beginning in 02/2022.  She presents to the Survivorship Clinic for our initial meeting and routine follow-up post-completion of treatment for breast cancer.    1. Stage 0 left breast cancer:  Melissa Frey is continuing to recover from definitive treatment for breast cancer. She will follow-up with her medical oncologist, Dr. Pamelia Hoit in 6 months with history and physical exam per surveillance protocol.  She will continue her anti-estrogen therapy with Tamoxifen. Thus far, she is tolerating the Tamoxifen well, with minimal side effects. Her mammogram is due 11/2022; orders placed today.     Today, a comprehensive survivorship care plan and treatment summary was reviewed with the patient today detailing her breast cancer diagnosis, treatment course, potential late/long-term effects of treatment, appropriate follow-up care with recommendations for the future, and patient education resources.  A copy of this summary, along with a letter will be sent to the patient's primary care provider via mail/fax/In Basket message after today's visit.    2. Bone health:   She was given education on specific activities to promote bone health.  3. Cancer screening:  Due to Melissa Frey's history and her age, she should receive  screening for skin cancers, colon cancer.  The information and recommendations are listed on the patient's comprehensive care plan/treatment summary and were reviewed in detail with the patient.    4. Health maintenance and wellness promotion: Melissa Frey was encouraged to consume 5-7 servings of fruits and vegetables per day. We reviewed the "Nutrition Rainbow" handout.  She was also encouraged to engage in moderate to vigorous exercise for 30 minutes per day most days of the week.  She was instructed  to limit her alcohol consumption and continue to abstain from tobacco use.     5. Support services/counseling: It is not uncommon for this period of the patient's cancer care trajectory to be one of many emotions and stressors.   She was given information regarding our available services and encouraged to contact me with any questions or for help enrolling in any of our support group/programs.    Follow up instructions:    -Return to cancer center in 6 months for f/u with Dr. Pamelia Hoit  -Mammogram due in 11/2022 -She is welcome to return back to the Survivorship Clinic at any time; no additional follow-up needed at this time.  -Consider referral back to survivorship as a long-term survivor for continued surveillance  The patient was provided an opportunity to ask questions and all were answered. The patient agreed with the plan and demonstrated an understanding of the instructions.   Total encounter time:30 minutes*in face-to-face visit time, chart review, lab review, care coordination, order entry, and documentation of the encounter time.    Lillard Anes, NP 06/18/22 10:54 AM Medical Oncology and Hematology [facility] [address] [place], [place] [address] Tel. [phone]    Fax. [phone]  *Total Encounter Time as defined by the Centers for Medicare and Medicaid Services includes, in addition to the face-to-face time of a patient visit (documented in the note above)  non-face-to-face time: obtaining and reviewing outside history, ordering and reviewing medications, tests or procedures, care coordination (communications with other health care professionals or caregivers) and documentation in the medical record.

## 2022-06-18 NOTE — Telephone Encounter (Signed)
Orders faxed to [facility].

## 2022-06-25 ENCOUNTER — Ambulatory Visit: Payer: PPO

## 2022-06-25 DIAGNOSIS — R072 Precordial pain: Secondary | ICD-10-CM | POA: Diagnosis not present

## 2022-06-25 DIAGNOSIS — I700 Atherosclerosis of aorta: Secondary | ICD-10-CM | POA: Diagnosis not present

## 2022-06-25 NOTE — Progress Notes (Signed)
Follow up visit  Subjective:   Melissa Frey, female    DOB: January 19, 1946, 77 y.o.   MRN: [id number]     HPI  Chief Complaint  Patient presents with   Chest Pain   Follow-up    77 y.o. Caucasian female with aortic atherosclerosis, hyperlipidemia, h/o breast cancer s/p left lumpectomy, h/o gastric ulcer, diverticulosis, colon polyps, hemorrhoids, referred for chest pain  Chest pain symptoms have since resolved. Reviewed recent test results with the patient, details below.    Current Outpatient Medications:    acetaminophen (TYLENOL) 650 MG CR tablet, Take 1,300 mg by mouth at bedtime., Disp: , Rfl:    alendronate (FOSAMAX) 70 MG tablet, Take 70 mg by mouth once a week., Disp: , Rfl:    ascorbic acid (VITAMIN C) 500 MG tablet, Take 500 mg by mouth daily., Disp: , Rfl:    BIOTIN PO, Take 1 tablet by mouth daily., Disp: , Rfl:    Calcium Carbonate-Vitamin D (CALCIUM-D PO), Take 1 tablet by mouth daily., Disp: , Rfl:    Coenzyme Q10 (COQ10 PO), Take 1 capsule by mouth daily., Disp: , Rfl:    conjugated estrogens (PREMARIN) vaginal cream, Place 1 Applicatorful vaginally as needed (dryness)., Disp: , Rfl:    Multiple Vitamin (MULTIVITAMIN WITH MINERALS) TABS tablet, Take 1 tablet by mouth daily., Disp: , Rfl:    rosuvastatin (CRESTOR) 10 MG tablet, Take 1 tablet by mouth once daily (Patient taking differently: Take 10 mg by mouth at bedtime.), Disp: 90 tablet, Rfl: 0   tamoxifen (NOLVADEX) 10 MG tablet, Take 1 tablet (10 mg total) by mouth daily., Disp: 90 tablet, Rfl: 3   Cardiovascular & other pertient studies:  Reviewed external labs and tests, independently interpreted  EKG 05/14/2022: Sinus rhythm 71 bpm Possible old anteroseptal infarct   Echocardiogram 06/25/2022: Normal LV systolic function with visual EF 60-65%. Left ventricle cavity is normal in size. Normal left ventricular wall thickness. Normal global wall motion. Normal diastolic filling pattern, normal LAP.   Trileaflet aortic valve. Moderate (Grade II) aortic regurgitation. Mild aortic valve leaflet thickening. Mild (Grade I) mitral regurgitation. Mild mitral valve leaflet thickening. Structurally normal tricuspid valve. Mild tricuspid regurgitation. No evidence of pulmonary hypertension. Structurally normal pulmonic valve. Mild pulmonic regurgitation. No significant change compared to 12/2017.  Exercise Myoview stress test 05/26/2022: Exercise nuclear stress test was performed using Bruce protocol.   1 Day Rest and Stress images. Exercise time 6 minutes 17 seconds on Bruce protocol, achieved 7.49 METS, 104% of age-predicted maximum heart rate (APMHR). Stress ECG negative for ischemia. Normal myocardial perfusion without evidence of prior ischemia or infarct. Left ventricular size is normal, no obvious regional wall motion abnormalities. Calculated LVEF 55%. Low risk study.    Recent labs: 01/08/2022: Glucose 88, BUN/Cr 14/0.85. EGFR >60. Na/K 140/4.2. Rest of the CMP normal H/H 13/40. MCV 93. Platelets 193   Review of Systems  Cardiovascular:  Negative for chest pain, dyspnea on exertion, leg swelling, palpitations and syncope.         Vitals:   07/09/22 0937  BP: 113/74  Pulse: 68  Resp: 16  SpO2: 98%    Body mass index is 22.85 kg/m. Filed Weights   07/09/22 0937  Weight: 117 lb (53.1 kg)     Objective:   Physical Exam Vitals and nursing note reviewed.  Constitutional:      General: She is not in acute distress. Neck:     Vascular: No JVD.  Cardiovascular:  Rate and Rhythm: Normal rate and regular rhythm.     Heart sounds: Normal heart sounds. No murmur heard. Pulmonary:     Effort: Pulmonary effort is normal.     Breath sounds: Normal breath sounds. No wheezing or rales.  Musculoskeletal:     Right lower leg: No edema.     Left lower leg: No edema.             Visit diagnoses:   ICD-10-CM   1. Precordial pain  R07.2     2. Aortic  atherosclerosis (HCC)  I70.0         Assessment & Recommendations:   77 y.o. Caucasian female with aortic atherosclerosis, hyperlipidemia, h/o breast cancer s/p left lumpectomy, h/o gastric ulcer, diverticulosis, colon polyps, hemorrhoids, referred for chest pain  Chest pain: Normal EF, and diastolic function.  Moderate AI, mild MR, mild TR, mild PI.  No ischemia on stress testing (April - May 2024). Consider noncardiac etiology for chest pain.  Aortic atherosclerosis: Continue Crestor 10 mg daily.  I will see her on as needed basis    Elder Negus, MD Pager: [phone] Office: [phone]

## 2022-07-03 ENCOUNTER — Ambulatory Visit: Payer: PPO | Admitting: Cardiology

## 2022-07-09 ENCOUNTER — Encounter: Payer: Self-pay | Admitting: Cardiology

## 2022-07-09 ENCOUNTER — Ambulatory Visit: Payer: PPO | Admitting: Cardiology

## 2022-07-09 VITALS — BP 113/74 | HR 68 | Resp 16 | Ht 60.0 in | Wt 117.0 lb

## 2022-07-09 DIAGNOSIS — I700 Atherosclerosis of aorta: Secondary | ICD-10-CM | POA: Diagnosis not present

## 2022-07-09 DIAGNOSIS — R072 Precordial pain: Secondary | ICD-10-CM | POA: Insufficient documentation

## 2022-09-15 DIAGNOSIS — Z853 Personal history of malignant neoplasm of breast: Secondary | ICD-10-CM | POA: Diagnosis not present

## 2022-09-15 DIAGNOSIS — N952 Postmenopausal atrophic vaginitis: Secondary | ICD-10-CM | POA: Diagnosis not present

## 2022-09-15 DIAGNOSIS — Z6822 Body mass index (BMI) 22.0-22.9, adult: Secondary | ICD-10-CM | POA: Diagnosis not present

## 2022-09-15 DIAGNOSIS — Z124 Encounter for screening for malignant neoplasm of cervix: Secondary | ICD-10-CM | POA: Diagnosis not present

## 2022-12-10 DIAGNOSIS — Z853 Personal history of malignant neoplasm of breast: Secondary | ICD-10-CM | POA: Diagnosis not present

## 2022-12-11 ENCOUNTER — Encounter: Payer: Self-pay | Admitting: Adult Health

## 2022-12-16 DIAGNOSIS — C44519 Basal cell carcinoma of skin of other part of trunk: Secondary | ICD-10-CM | POA: Diagnosis not present

## 2022-12-16 DIAGNOSIS — Z85828 Personal history of other malignant neoplasm of skin: Secondary | ICD-10-CM | POA: Diagnosis not present

## 2022-12-16 DIAGNOSIS — L814 Other melanin hyperpigmentation: Secondary | ICD-10-CM | POA: Diagnosis not present

## 2022-12-16 DIAGNOSIS — R7989 Other specified abnormal findings of blood chemistry: Secondary | ICD-10-CM | POA: Diagnosis not present

## 2022-12-16 DIAGNOSIS — D239 Other benign neoplasm of skin, unspecified: Secondary | ICD-10-CM | POA: Diagnosis not present

## 2022-12-16 DIAGNOSIS — L309 Dermatitis, unspecified: Secondary | ICD-10-CM | POA: Diagnosis not present

## 2022-12-16 DIAGNOSIS — D225 Melanocytic nevi of trunk: Secondary | ICD-10-CM | POA: Diagnosis not present

## 2022-12-16 DIAGNOSIS — R7301 Impaired fasting glucose: Secondary | ICD-10-CM | POA: Diagnosis not present

## 2022-12-16 DIAGNOSIS — L821 Other seborrheic keratosis: Secondary | ICD-10-CM | POA: Diagnosis not present

## 2022-12-16 DIAGNOSIS — D485 Neoplasm of uncertain behavior of skin: Secondary | ICD-10-CM | POA: Diagnosis not present

## 2022-12-16 DIAGNOSIS — M810 Age-related osteoporosis without current pathological fracture: Secondary | ICD-10-CM | POA: Diagnosis not present

## 2022-12-16 DIAGNOSIS — E785 Hyperlipidemia, unspecified: Secondary | ICD-10-CM | POA: Diagnosis not present

## 2022-12-23 ENCOUNTER — Ambulatory Visit: Payer: PPO | Admitting: Hematology and Oncology

## 2022-12-23 ENCOUNTER — Inpatient Hospital Stay: Payer: PPO | Attending: Hematology and Oncology | Admitting: Hematology and Oncology

## 2022-12-23 VITALS — BP 130/80 | HR 80 | Temp 98.7°F | Resp 18 | Ht 60.0 in | Wt 118.2 lb

## 2022-12-23 DIAGNOSIS — C50912 Malignant neoplasm of unspecified site of left female breast: Secondary | ICD-10-CM | POA: Diagnosis not present

## 2022-12-23 DIAGNOSIS — D0512 Intraductal carcinoma in situ of left breast: Secondary | ICD-10-CM | POA: Insufficient documentation

## 2022-12-23 DIAGNOSIS — E785 Hyperlipidemia, unspecified: Secondary | ICD-10-CM | POA: Diagnosis not present

## 2022-12-23 DIAGNOSIS — Z79810 Long term (current) use of selective estrogen receptor modulators (SERMs): Secondary | ICD-10-CM | POA: Insufficient documentation

## 2022-12-23 DIAGNOSIS — L900 Lichen sclerosus et atrophicus: Secondary | ICD-10-CM | POA: Diagnosis not present

## 2022-12-23 DIAGNOSIS — Z170 Estrogen receptor positive status [ER+]: Secondary | ICD-10-CM | POA: Diagnosis not present

## 2022-12-23 DIAGNOSIS — Z1721 Progesterone receptor positive status: Secondary | ICD-10-CM | POA: Insufficient documentation

## 2022-12-23 DIAGNOSIS — S22000A Wedge compression fracture of unspecified thoracic vertebra, initial encounter for closed fracture: Secondary | ICD-10-CM | POA: Diagnosis not present

## 2022-12-23 DIAGNOSIS — I700 Atherosclerosis of aorta: Secondary | ICD-10-CM | POA: Diagnosis not present

## 2022-12-23 DIAGNOSIS — M810 Age-related osteoporosis without current pathological fracture: Secondary | ICD-10-CM | POA: Diagnosis not present

## 2022-12-23 DIAGNOSIS — R079 Chest pain, unspecified: Secondary | ICD-10-CM | POA: Diagnosis not present

## 2022-12-23 DIAGNOSIS — Z1331 Encounter for screening for depression: Secondary | ICD-10-CM | POA: Diagnosis not present

## 2022-12-23 DIAGNOSIS — Z1339 Encounter for screening examination for other mental health and behavioral disorders: Secondary | ICD-10-CM | POA: Diagnosis not present

## 2022-12-23 DIAGNOSIS — Z0000 Encounter for general adult medical examination without abnormal findings: Secondary | ICD-10-CM | POA: Diagnosis not present

## 2022-12-23 MED ORDER — ESTRADIOL 0.1 MG/GM VA CREA: 1.0000 | TOPICAL_CREAM | Freq: Every day | VAGINAL | 12 refills | Status: AC

## 2022-12-23 NOTE — Progress Notes (Signed)
Patient Care Team: Alysia Penna, MD as PCP - General (Internal Medicine) Rinaldo Ratel, MD as Orthopedic Technician (Orthopedic Surgery) Olevia Perches, NP as Nurse Practitioner (Family Medicine) Donnetta Hail, MD as Consulting Physician (Rheumatology) Serena Croissant, MD as Consulting Physician (Hematology and Oncology) Dorothy Puffer, MD as Consulting Physician (Radiation Oncology) Emelia Loron, MD as Consulting Physician (General Surgery)  DIAGNOSIS:  Encounter Diagnosis  Name Primary?   Ductal carcinoma in situ (DCIS) of left breast Yes    SUMMARY OF ONCOLOGIC HISTORY: Oncology History  Ductal carcinoma in situ (DCIS) of left breast  12/23/2021 Initial Diagnosis   Screening detected left breast calcifications upper medial quadrant 0.8 cm, axilla negative, biopsy: Intermediate grade DCIS ER 95%, PR 50%   01/08/2022 Cancer Staging   Staging form: Breast, AJCC 8th Edition - Clinical: Stage 0 (cTis (DCIS), cN0, cM0, G2, ER+, PR+, HER2: Not Assessed) - Signed by Serena Croissant, MD on 01/08/2022 Stage prefix: Initial diagnosis Histologic grading system: 3 grade system   01/15/2022 Genetic Testing   Negative hereditary cancer genetic testing: no pathogenic variants detected in [facility] Common Hereditary Cancers +RNA Panel.  Report date is 01/15/2022.    The [facility] Common Hereditary Cancers + RNA Panel includes sequencing, deletion/duplication, and RNA analysis of the following 48 genes: APC, ATM, AXIN2, BAP1, BARD1, BMPR1A, BRCA1, BRCA2, BRIP1, CDH1, CDK4*, CDKN2A*, CHEK2, CTNNA1, DICER1, EPCAM* (del/dup only), FH, GREM1* (promoter dup analysis only), HOXB13*, KIT*, MBD4*, MEN1, MLH1, MSH2, MSH3, MSH6, MUTYH, NF1, NTHL1, PALB2, PDGFRA*, PMS2, POLD1, POLE, PTEN, RAD51C, RAD51D, SDHA (sequencing only), SDHB, SDHC, SDHD, SMAD4, SMARCA4, STK11, TP53, TSC1, TSC2, VHL.  *Genes without RNA analysis.    01/20/2022 Surgery   Left lumpectomy: 1 cm intermediate grade DCIS, no invasive  cancer, margins negative, ER 95%, PR 50%    02/2022 -  Anti-estrogen oral therapy   Tamoxifen daily   06/18/2022 Cancer Staging   Staging form: Breast, AJCC 8th Edition - Pathologic: Stage 0 (pTis (DCIS), pN0, cM0, ER+, PR+) - Signed by Loa Socks, NP on 06/18/2022 Nuclear grade: G2     CHIEF COMPLIANT: Follow-up on tamoxifen therapy  HISTORY OF PRESENT ILLNESS:  History of Present Illness   The patient has a history of DCIS, currently on tamoxifen since January. She reports that the medication is well-tolerated. She also has a history of bone health issues and is on Fosamax. She is active and exercises regularly, including strength training and calisthenics. She has recently noticed vaginal dryness, which she attributes to stopping Premarin and starting tamoxifen. She also mentions a recent skin treatment with a steroid cream.        ALLERGIES:  is allergic to covid-19 mrna vacc (moderna).  MEDICATIONS:  Current Outpatient Medications  Medication Sig Dispense Refill   estradiol (ESTRACE VAGINAL) 0.1 MG/GM vaginal cream Place 1 Applicatorful vaginally at bedtime. 42.5 g 12   acetaminophen (TYLENOL) 650 MG CR tablet Take 1,300 mg by mouth at bedtime.     alendronate (FOSAMAX) 70 MG tablet Take 70 mg by mouth once a week.     ascorbic acid (VITAMIN C) 500 MG tablet Take 500 mg by mouth daily.     BIOTIN PO Take 1 tablet by mouth daily.     Calcium Carbonate-Vitamin D (CALCIUM-D PO) Take 1 tablet by mouth daily.     Coenzyme Q10 (COQ10 PO) Take 1 capsule by mouth daily.     conjugated estrogens (PREMARIN) vaginal cream Place 1 Applicatorful vaginally as needed (dryness).  Multiple Vitamin (MULTIVITAMIN WITH MINERALS) TABS tablet Take 1 tablet by mouth daily.     rosuvastatin (CRESTOR) 10 MG tablet Take 1 tablet by mouth once daily (Patient taking differently: Take 10 mg by mouth at bedtime.) 90 tablet 0   tamoxifen (NOLVADEX) 10 MG tablet Take 1 tablet (10 mg total) by  mouth daily. 90 tablet 3   No current facility-administered medications for this visit.    PHYSICAL EXAMINATION: ECOG PERFORMANCE STATUS: 1 - Symptomatic but completely ambulatory  Vitals:   12/23/22 1435  BP: 130/80  Pulse: 80  Resp: 18  Temp: 98.7 F (37.1 C)  SpO2: 99%   Filed Weights   12/23/22 1435  Weight: 118 lb 3.2 oz (53.6 kg)      LABORATORY DATA:  I have reviewed the data as listed    Latest Ref Rng & Units 01/08/2022    8:13 AM 02/26/2020   12:38 PM 01/24/2020    3:27 PM  CMP  Glucose 70 - 99 mg/dL 88  97  096   BUN 8 - 23 mg/dL 14  10  19    Creatinine 0.44 - 1.00 mg/dL 0.45  4.09  8.11   Sodium 135 - 145 mmol/L 140  141  141   Potassium 3.5 - 5.1 mmol/L 4.2  3.9  4.2   Chloride 98 - 111 mmol/L 104  104  104   CO2 22 - 32 mmol/L 32  28  24   Calcium 8.9 - 10.3 mg/dL 9.9  9.6  9.1   Total Protein 6.5 - 8.1 g/dL 6.9   6.8   Total Bilirubin 0.3 - 1.2 mg/dL 0.5   0.3   Alkaline Phos 38 - 126 U/L 49   59   AST 15 - 41 U/L 21   20   ALT 0 - 44 U/L 13   12     Lab Results  Component Value Date   WBC 4.7 01/08/2022   HGB 13.3 01/08/2022   HCT 40.3 01/08/2022   MCV 93.5 01/08/2022   PLT 193 01/08/2022   NEUTROABS 2.4 01/08/2022    ASSESSMENT & PLAN:  Ductal carcinoma in situ (DCIS) of left breast 12/23/2021:Screening detected left breast calcifications upper medial quadrant 0.8 cm, axilla negative, biopsy: Intermediate grade DCIS ER 95%, PR 50%    01/20/2022: Left lumpectomy: 1 cm intermediate grade DCIS, no invasive cancer, margins negative, ER 95%, PR 50% Met with radiation oncology and decided not to pursue radiation.  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   Vaginal Dryness Patient reports vaginal dryness and discomfort, likely due to Tamoxifen and discontinuation of Premarin. Discussed recent studies showing no increased risk of breast cancer with topical estrogen cream. -Prescribe topical estrogen cream for vaginal dryness, to be used twice a week.  General Health Maintenance Patient is maintaining an active lifestyle with regular exercise. She is up to date with her mammogram, which showed a density of B. -Encourage continuation of healthy lifestyle habits. -Plan for annual follow-up.      No orders of the defined types were placed in this encounter.  The patient has a good understanding of the overall plan. she agrees with it. she will call with any problems that may develop  before the next visit here. Total time spent: 30 mins including face to face time and time spent for planning, charting and co-ordination of care   Tamsen Meek, MD 12/23/22

## 2022-12-23 NOTE — Assessment & Plan Note (Addendum)
12/23/2021:Screening detected left breast calcifications upper medial quadrant 0.8 cm, axilla negative, biopsy: Intermediate grade DCIS ER 95%, PR 50%    01/20/2022: Left lumpectomy: 1 cm intermediate grade DCIS, no invasive cancer, margins negative, ER 95%, PR 50% Met with radiation oncology and decided not to pursue radiation.  Current treatment: Adjuvant antiestrogen therapy with tamoxifen x 5 years started 02/13/2022 Tamoxifen toxicities:  Breast cancer surveillance: Mammogram 12/11/2022: [facility]: Benign breast density category B Breast exam 12/23/2022: Benign  Left shoulder rotator cuff injury: Improved She is an avid Counsellor and photographer  Return to clinic in 1 year for follow-up

## 2023-02-03 ENCOUNTER — Other Ambulatory Visit: Payer: Self-pay | Admitting: Hematology and Oncology

## 2023-02-10 DIAGNOSIS — C44519 Basal cell carcinoma of skin of other part of trunk: Secondary | ICD-10-CM | POA: Diagnosis not present

## 2023-03-09 DIAGNOSIS — M19071 Primary osteoarthritis, right ankle and foot: Secondary | ICD-10-CM | POA: Diagnosis not present

## 2023-03-09 DIAGNOSIS — M79671 Pain in right foot: Secondary | ICD-10-CM | POA: Diagnosis not present

## 2023-03-16 DIAGNOSIS — M2550 Pain in unspecified joint: Secondary | ICD-10-CM | POA: Diagnosis not present

## 2023-03-16 DIAGNOSIS — E785 Hyperlipidemia, unspecified: Secondary | ICD-10-CM | POA: Diagnosis not present

## 2023-03-16 DIAGNOSIS — M79671 Pain in right foot: Secondary | ICD-10-CM | POA: Diagnosis not present

## 2023-03-26 ENCOUNTER — Ambulatory Visit: Payer: PPO | Admitting: Podiatry

## 2023-05-02 ENCOUNTER — Other Ambulatory Visit: Payer: Self-pay | Admitting: Hematology and Oncology

## 2023-05-26 DIAGNOSIS — H5211 Myopia, right eye: Secondary | ICD-10-CM | POA: Diagnosis not present

## 2023-05-26 DIAGNOSIS — Z961 Presence of intraocular lens: Secondary | ICD-10-CM | POA: Diagnosis not present

## 2023-05-26 DIAGNOSIS — H11002 Unspecified pterygium of left eye: Secondary | ICD-10-CM | POA: Diagnosis not present

## 2023-09-21 ENCOUNTER — Ambulatory Visit (INDEPENDENT_AMBULATORY_CARE_PROVIDER_SITE_OTHER): Admitting: Gastroenterology

## 2023-09-21 ENCOUNTER — Encounter: Payer: Self-pay | Admitting: Gastroenterology

## 2023-09-21 VITALS — BP 122/64 | HR 98 | Ht 60.0 in | Wt 116.0 lb

## 2023-09-21 DIAGNOSIS — R1310 Dysphagia, unspecified: Secondary | ICD-10-CM

## 2023-09-21 DIAGNOSIS — Z860100 Personal history of colon polyps, unspecified: Secondary | ICD-10-CM

## 2023-09-21 NOTE — Progress Notes (Signed)
 HPI :  78 year old female here for follow-up visit for dysphagia.  Recall she has a history of dysphagia, esophageal stricture, history of gastric ulcers, diverticulosis, history of colon polyps.  Recall she has had a prior workup for dysphagia.  This has been ongoing for some time.  Recall normal barium swallow in 2016 with a modified barium swallow in 2016 as well.  Prominent osteophytes in the C-spine with questionable extrinsic compression.  EGD in 2019 with a 3 cm hiatal hernia and no obvious stenosis.  She had a repeat EGD in March 2022, 5 cm hiatal hernia noted, mild distal esophageal stricture, dilated with savory dilation given her symptoms are more so oropharyngeal however dilation effect noted at the stricture.  She incidentally had some small/diminutive gastric ulcers.  She tested negative for H. pylori.  She was using Naprosyn routinely at the time for joint pains.  She states she definitely had benefit from the dilation which improved her swallowing.  After about 6 months her symptoms started slowly recurring.  This has been ongoing for a few years now.  She states that usually when she eats something like chicken or something bulky, she can feel it gets stuck in her throat area as well as her lower chest.  She feels if it does not pass quickly then she will need to regurgitate it out.  Typically that been happening once every 4 to 6 weeks.  However, more recently over the summer she really has not had any episodes or any symptoms that have bothered her.  She denies any heartburn or reflux, she otherwise is feeling well.  She reminds me she had a very severe reaction to the COVID booster which took some time to sort out exactly what was causing other systemic symptoms at the time.  She denies any cardiopulmonary symptoms.  She had an echocardiogram in 2024 for which showed stable changes as outlined below  Of note recall she had a colonoscopy with me in 2022 it as well, has severe  diverticular change in the left colon with luminal narrowing.  Had to use an upper endoscope to traverse this area.  A few diminutive polyps noted but nothing high risk.   We discussed if she wanted to have any future colonoscopy exams.         Prior endoscopic evaluation:   Colonoscopy 02/26/2015 - There was severe diverticulosis in the left colon and mild diverticulosis in the right colon. The colon was very angulated and the diverticulosis made for a difficult intubation. A diminutive sessile cecal polyp was noted and removed with cold forceps. A 7mm and 4mm sessile ascending colon polyp was noted and removed via cold snare. A semipedunculated roughly 5-70mm polyp was noted at an angulated turn in the sigmoid colon and removed via hot snare. The remainder of the colon was normal. Retroflexed views revealed internal hemorrhoids. Path c/w one adenoma, rest sessile serrated   EGD 08/25/2017 -  - A 3 cm hiatal hernia was present. - The exam of the esophagus was otherwise normal. - The entire examined stomach was normal. - Biopsies were taken with a cold forceps in the gastric body, at the incisura and in the gastric antrum for Helicobacter pylori testing. - The duodenal bulb and second portion of the duodenum were normal.   EGD 04/12/20: - Esophagogastric landmarks were identified: the Z-line was found at 30 cm, the gastroesophageal junction was found at 30 cm and the upper extent of the gastric folds was found  at 35 cm from the incisors. Findings: - A 5 cm hiatal hernia was present. - LA Grade A esophagitis was found 30 cm from the incisors. - One benign-appearing, intrinsic mild stenosis was found 30 cm from the incisors. This stenosis measured less than one cm (in length). A guidewire was placed and the scope was withdrawn. Dilation was performed with a Savary dilator with mild resistance at 17 mm (as opposed to a balloon in case proximal subtle stenosis related to symptoms). [place]ook endoscopy showed  appropriate mucosal wrent at the stricture - The exam of the esophagus was otherwise normal. - Three cratered gastric ulcers were found in the gastric antrum. The largest lesion was 3 mm in largest dimension. Biopsies were taken with a cold forceps for histology. - The exam of the stomach was otherwise normal. - Biopsies were taken with a cold forceps in the gastric body, at the incisura and in the gastric antrum for Helicobacter pylori testing. - The duodenal bulb and second portion of the duodenum were normal.   Colonoscopy 04/12/20: - The perianal and digital rectal examinations were normal. - Two sessile polyps were found in the cecum. The polyps were diminutive in size. These polyps were removed with a cold snare. Resection and retrieval were complete. - A diminutive polyp was found in the ascending colon. The polyp was sessile. The polyp was removed with a cold snare. Resection and retrieval were complete. - Multiple medium-mouthed diverticula were found in the entire colon, highest burden in the left colon where there was restricted mobility of the colon. A pediatric colonoscope could not traverse the area due to luminal narrowing. An upper endoscope was used to traverse the sigmoid colon and achieve cecal intubation, which prolonged the exam. - Internal hemorrhoids were found. - The exam was otherwise without abnormality.   1. Surgical [P], gastric ulcer bx - GASTRIC ANTRAL MUCOSA WITH NONSPECIFIC REACTIVE GASTROPATHY - NEGATIVE FOR INTESTINAL METAPLASIA, DYSPLASIA OR MALIGNANCY - WARTHIN STARRY STAIN IS NEGATIVE FOR HELICOBACTER PYLORI 2. Surgical [P], gastric bx - GASTRIC ANTRAL MUCOSA WITH MILD NONSPECIFIC REACTIVE GASTROPATHY - GASTRIC OXYNTIC MUCOSA WITH NO SPECIFIC HISTOPATHOLOGIC CHANGES - WARTHIN STARRY STAIN IS NEGATIVE FOR HELICOBACTER PYLORI 3. Surgical [P], colon, cecum, ascending, polyp (3) - TUBULAR ADENOMA(S) - NEGATIVE FOR HIGH-GRADE DYSPLASIA O      Echo  06/25/22: Echocardiogram 06/25/2022:  Normal LV systolic function with visual EF 60-65%. Left ventricle cavity  is normal in size. Normal left ventricular wall thickness. Normal global  wall motion. Normal diastolic filling pattern, normal LAP. Calculated EF  61%.  Trileaflet aortic valve. Moderate (Grade II) aortic regurgitation. Mild  aortic valve leaflet thickening.  Mild (Grade I) mitral regurgitation. Mild mitral valve leaflet thickening.  Structurally normal tricuspid valve. Mild tricuspid regurgitation. No  evidence of pulmonary hypertension.  Structurally normal pulmonic valve. Mild pulmonic regurgitation.  No significant change compared to 12/2017.      Past Medical History:  Diagnosis Date   Breast cancer (HCC)    Cataract    right cateract removed   Diverticulosis    Dysphagia    Gastritis    GERD (gastroesophageal reflux disease)    History of colon polyps    Hyperlipidemia    Internal hemorrhoids    Osteoporosis      Past Surgical History:  Procedure Laterality Date   BELPHAROPTOSIS REPAIR     BREAST LUMPECTOMY WITH RADIOACTIVE SEED LOCALIZATION Left 01/20/2022   Procedure: LEFT BREAST LUMPECTOMY WITH RADIOACTIVE SEED LOCALIZATION;  Surgeon: Ebbie,  Donnice, MD;  Location: [facility];  Service: General;  Laterality: Left;  60 MIN ROOM 8   CATARACT EXTRACTION  04/2013   CESAREAN SECTION  1977/1983   x3   FOOT SURGERY Right 02/2012   HEMORRHOID BANDING     skin cancer removal     face   Family History  Problem Relation Age of Onset   Heart failure Mother    Breast cancer Mother 46   Cancer Father    Breast cancer Cousin 25       maternal female cousin   Colon cancer Neg Hx    Esophageal cancer Neg Hx    Rectal cancer Neg Hx    Stomach cancer Neg Hx    Social History   Tobacco Use   Smoking status: Former    Current packs/day: 0.00    Average packs/day: 1 pack/day for 1.5 years (1.5 ttl pk-yrs)    Types: Cigarettes    Start  date: 08/1966    Quit date: 1970    Years since quitting: 55.6   Smokeless tobacco: Never  Vaping Use   Vaping status: Never Used  Substance Use Topics   Alcohol  use: Yes    Alcohol /week: 7.0 standard drinks of alcohol     Types: 7 Glasses of wine per week    Comment: 5-7 glasses red wine per week per patient.   Drug use: No   Current Outpatient Medications  Medication Sig Dispense Refill   acetaminophen  (TYLENOL ) 650 MG CR tablet Take 1,300 mg by mouth at bedtime.     alendronate (FOSAMAX) 70 MG tablet Take 70 mg by mouth once a week.     ascorbic acid (VITAMIN C) 500 MG tablet Take 500 mg by mouth daily.     BIOTIN PO Take 1 tablet by mouth daily.     Calcium Carbonate-Vitamin D (CALCIUM-D PO) Take 1 tablet by mouth daily.     Coenzyme Q10 (COQ10 PO) Take 1 capsule by mouth daily.     estradiol  (ESTRACE  VAGINAL) 0.1 MG/GM vaginal cream Place 1 Applicatorful vaginally at bedtime. 42.5 g 12   Multiple Vitamin (MULTIVITAMIN WITH MINERALS) TABS tablet Take 1 tablet by mouth daily.     rosuvastatin (CRESTOR) 10 MG tablet Take 1 tablet by mouth once daily (Patient taking differently: Take 10 mg by mouth at bedtime.) 90 tablet 0   tamoxifen  (NOLVADEX ) 10 MG tablet Take 1 tablet by mouth once daily 90 tablet 3   conjugated estrogens (PREMARIN) vaginal cream Place 1 Applicatorful vaginally as needed (dryness).     No current facility-administered medications for this visit.   Allergies  Allergen Reactions   Covid-19 Mrna Vacc (Moderna) Swelling    Reaction to booster shot 12/19/2019 - severe swelling/arthritis/joint pain/raised bumps - no reaction to 1st 2 shots     Review of Systems: All systems reviewed and negative except where noted in HPI.   Physical Exam: BP 122/64   Pulse 98   Ht 5' (1.524 m)   Wt 116 lb (52.6 kg)   BMI 22.65 kg/m  Constitutional: Pleasant,well-developed, female in no acute distress. Neurological: Alert and oriented to person place and  time. Psychiatric: Normal mood and affect. Behavior is normal.   ASSESSMENT: 78 y.o. female here for assessment of the following  1. Dysphagia, unspecified type   2. History of colon polyps    As above, history of intermittent dysphagia to solids.  She has had an EGD with dilation with me in 2022 which  completely resolved her symptoms for at least 6 months, she has had some recurrent intermittent symptoms since then.  Unclear if this is due to subtle proximal stenosis around the UES (she seems to localize her symptoms there) versus mild GEJ stenosis.  We discussed the natural history of this, given her symptoms have been persistent over years I suspect she may need periodic dilations.  I discussed EGD, risks for the exam and anesthesia.  In general she thinks she wants to do this however over the summer she has been doing much better lately and not having any significant symptoms that bother her.  She would like to hold off right now given she has some travel planned but we will plan on contacting me when she feels symptoms are bad enough to warrant the dilation.  She does not need an appointment for scheduling the EGD if no other changes in her health care status.  Otherwise denies any reflux at present time.  We reviewed her history of her last colonoscopy, very difficult exam with low risk polyps.  Given her age and difficulty with the last exam, no further surveillance planned.  She agrees   Marcey Naval, MD [facility]

## 2023-09-21 NOTE — Patient Instructions (Addendum)
 Please let us  know if your symptoms reoccur and you would like to schedule an EGD with dilation.  ______________________________________________________  If your blood pressure at your visit was 140/90 or greater, please contact your primary care physician to follow up on this.  _______________________________________________________  If you are age 77 or older, your body mass index should be between 23-30. Your Body mass index is 22.65 kg/m. If this is out of the aforementioned range listed, please consider follow up with your Primary Care Provider.  If you are age 21 or younger, your body mass index should be between 19-25. Your Body mass index is 22.65 kg/m. If this is out of the aformentioned range listed, please consider follow up with your Primary Care Provider.   ________________________________________________________  The [facility] providers would like to encourage you to use MYCHART to communicate with providers for non-urgent requests or questions.  Due to long hold times on the telephone, sending your provider a message by [facility] may be a faster and more efficient way to get a response.  Please allow 48 business hours for a response.  Please remember that this is for non-urgent requests.  _______________________________________________________  [facility] is using a team-based approach to care.  Your team is made up of your doctor and two to three APPS. Our APPS (Nurse Practitioners and Physician Assistants) work with your physician to ensure care continuity for you. They are fully qualified to address your health concerns and develop a treatment plan. They communicate directly with your gastroenterologist to care for you. Seeing the Advanced Practice Practitioners on your physician's team can help you by facilitating care more promptly, often allowing for earlier appointments, access to diagnostic testing, procedures, and other specialty referrals.   Thank you for  entrusting me with your care and for choosing [facility], Dr. Elspeth Naval

## 2023-10-27 DIAGNOSIS — N952 Postmenopausal atrophic vaginitis: Secondary | ICD-10-CM | POA: Diagnosis not present

## 2023-10-27 DIAGNOSIS — Z779 Other contact with and (suspected) exposures hazardous to health: Secondary | ICD-10-CM | POA: Diagnosis not present

## 2023-10-27 DIAGNOSIS — Z853 Personal history of malignant neoplasm of breast: Secondary | ICD-10-CM | POA: Diagnosis not present

## 2023-10-27 DIAGNOSIS — Z6822 Body mass index (BMI) 22.0-22.9, adult: Secondary | ICD-10-CM | POA: Diagnosis not present

## 2023-12-21 DIAGNOSIS — R928 Other abnormal and inconclusive findings on diagnostic imaging of breast: Secondary | ICD-10-CM | POA: Diagnosis not present

## 2023-12-22 ENCOUNTER — Encounter: Payer: Self-pay | Admitting: Hematology and Oncology

## 2023-12-22 DIAGNOSIS — E7849 Other hyperlipidemia: Secondary | ICD-10-CM | POA: Diagnosis not present

## 2023-12-24 ENCOUNTER — Inpatient Hospital Stay: Payer: PPO | Attending: Hematology and Oncology | Admitting: Hematology and Oncology

## 2023-12-24 VITALS — BP 136/71 | HR 105 | Temp 97.9°F | Resp 18 | Ht 60.0 in | Wt 116.6 lb

## 2023-12-24 DIAGNOSIS — Z79810 Long term (current) use of selective estrogen receptor modulators (SERMs): Secondary | ICD-10-CM | POA: Diagnosis not present

## 2023-12-24 DIAGNOSIS — N951 Menopausal and female climacteric states: Secondary | ICD-10-CM | POA: Insufficient documentation

## 2023-12-24 DIAGNOSIS — D0512 Intraductal carcinoma in situ of left breast: Secondary | ICD-10-CM | POA: Insufficient documentation

## 2023-12-24 DIAGNOSIS — Z1721 Progesterone receptor positive status: Secondary | ICD-10-CM | POA: Insufficient documentation

## 2023-12-24 DIAGNOSIS — Z170 Estrogen receptor positive status [ER+]: Secondary | ICD-10-CM | POA: Diagnosis not present

## 2023-12-24 NOTE — Progress Notes (Signed)
 [facility]  Patient Care Team: Larnell Hamilton, MD as PCP - General (Internal Medicine) Van Doughty, MD as Orthopedic Technician (Orthopedic Surgery) Jama Manuelita RAMAN, NP as Nurse Practitioner (Family Medicine) Mai Lynwood FALCON, MD as Consulting Physician (Rheumatology) Odean Potts, MD as Consulting Physician (Hematology and Oncology) Dewey Rush, MD as Consulting Physician (Radiation Oncology) Ebbie Cough, MD as Consulting Physician (General Surgery)  DIAGNOSIS:  Encounter Diagnosis  Name Primary?   Ductal carcinoma in situ (DCIS) of left breast Yes    SUMMARY OF ONCOLOGIC HISTORY: Oncology History  Ductal carcinoma in situ (DCIS) of left breast  12/23/2021 Initial Diagnosis   Screening detected left breast calcifications upper medial quadrant 0.8 cm, axilla negative, biopsy: Intermediate grade DCIS ER 95%, PR 50%   01/08/2022 Cancer Staging   Staging form: Breast, AJCC 8th Edition - Clinical: Stage 0 (cTis (DCIS), cN0, cM0, G2, ER+, PR+, HER2: Not Assessed) - Signed by Odean Potts, MD on 01/08/2022 Stage prefix: Initial diagnosis Histologic grading system: 3 grade system   01/15/2022 Genetic Testing   Negative hereditary cancer genetic testing: no pathogenic variants detected in [employer] Common Hereditary Cancers +RNA Panel.  Report date is 01/15/2022.    The [employer] Common Hereditary Cancers + RNA Panel includes sequencing, deletion/duplication, and RNA analysis of the following 48 genes: APC, ATM, AXIN2, BAP1, BARD1, BMPR1A, BRCA1, BRCA2, BRIP1, CDH1, CDK4*, CDKN2A*, CHEK2, CTNNA1, DICER1, EPCAM* (del/dup only), FH, GREM1* (promoter dup analysis only), HOXB13*, KIT*, MBD4*, MEN1, MLH1, MSH2, MSH3, MSH6, MUTYH, NF1, NTHL1, PALB2, PDGFRA*, PMS2, POLD1, POLE, PTEN, RAD51C, RAD51D, SDHA (sequencing only), SDHB, SDHC, SDHD, SMAD4, SMARCA4, STK11, TP53, TSC1, TSC2, VHL.  *Genes without RNA analysis.    01/20/2022 Surgery   Left lumpectomy: 1 cm intermediate grade DCIS, no  invasive cancer, margins negative, ER 95%, PR 50%    02/2022 -  Anti-estrogen oral therapy   Tamoxifen  daily   06/18/2022 Cancer Staging   Staging form: Breast, AJCC 8th Edition - Pathologic: Stage 0 (pTis (DCIS), pN0, cM0, ER+, PR+) - Signed by Crawford Morna Pickle, NP on 06/18/2022 Nuclear grade: G2     CHIEF COMPLIANT: Surveillance of breast cancer on tamoxifen   HISTORY OF PRESENT ILLNESS:  History of Present Illness Melissa Frey is a 78 year old female with a history of breast cancer who presents for a follow-up visit.  She is taking tamoxifen  without any side effects and remains compliant with her mammograms. Vaginal estrogen, started last year, has been beneficial. 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 She exercises daily, maintains a healthy diet, and travels frequently.     ALLERGIES:  has no active allergies.  MEDICATIONS:  Current Outpatient Medications  Medication Sig Dispense Refill   acetaminophen  (TYLENOL ) 650 MG CR tablet Take 1,300 mg by mouth at bedtime.     alendronate (FOSAMAX) 70 MG tablet Take 70 mg by mouth once a week.     ascorbic acid (VITAMIN C) 500 MG tablet Take 500 mg by mouth daily.     BIOTIN PO Take 1 tablet by mouth daily.     Calcium Carbonate-Vitamin D (CALCIUM-D PO) Take 1 tablet by mouth daily.     Coenzyme Q10 (COQ10 PO) Take 1 capsule by mouth daily.     estradiol  (ESTRACE  VAGINAL) 0.1 MG/GM vaginal cream Place 1 Applicatorful vaginally at bedtime. 42.5 g 12   Multiple Vitamin (MULTIVITAMIN WITH MINERALS) TABS tablet Take 1 tablet by mouth daily.  rosuvastatin (CRESTOR) 10 MG tablet Take 1 tablet by mouth once daily 90 tablet 0   tamoxifen  (NOLVADEX ) 10 MG tablet Take 1 tablet by mouth once daily 90 tablet 3   No current facility-administered medications for this visit.    PHYSICAL EXAMINATION: ECOG PERFORMANCE STATUS: 1 -  Symptomatic but completely ambulatory  Vitals:   12/24/23 1416  BP: 136/71  Pulse: (!) 105  Resp: 18  Temp: 97.9 F (36.6 C)  SpO2: 98%   Filed Weights   12/24/23 1416  Weight: 116 lb 9.6 oz (52.9 kg)    LABORATORY DATA:  I have reviewed the data as listed    Latest Ref Rng & Units 01/08/2022    8:13 AM 02/26/2020   12:38 PM 01/24/2020    3:27 PM  CMP  Glucose 70 - 99 mg/dL 88  97  886   BUN 8 - 23 mg/dL 14  10  19    Creatinine 0.44 - 1.00 mg/dL 9.14  9.11  9.10   Sodium 135 - 145 mmol/L 140  141  141   Potassium 3.5 - 5.1 mmol/L 4.2  3.9  4.2   Chloride 98 - 111 mmol/L 104  104  104   CO2 22 - 32 mmol/L 32  28  24   Calcium 8.9 - 10.3 mg/dL 9.9  9.6  9.1   Total Protein 6.5 - 8.1 g/dL 6.9   6.8   Total Bilirubin 0.3 - 1.2 mg/dL 0.5   0.3   Alkaline Phos 38 - 126 U/L 49   59   AST 15 - 41 U/L 21   20   ALT 0 - 44 U/L 13   12     Lab Results  Component Value Date   WBC 4.7 01/08/2022   HGB 13.3 01/08/2022   HCT 40.3 01/08/2022   MCV 93.5 01/08/2022   PLT 193 01/08/2022   NEUTROABS 2.4 01/08/2022    ASSESSMENT & PLAN:  Ductal carcinoma in situ (DCIS) of left breast 12/23/2021:Screening detected left breast calcifications upper medial quadrant 0.8 cm, axilla negative, biopsy: Intermediate grade DCIS ER 95%, PR 50%    01/20/2022: Left lumpectomy: 1 cm intermediate grade DCIS, no invasive cancer, margins negative, ER 95%, PR 50% Met with radiation oncology and decided not to pursue radiation.   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    Vaginal Dryness: Significant improvement with topical vaginal estrogen cream   Assessment &  Plan Ductal carcinoma in situ (DCIS) of left breast, on tamoxifen  DCIS of the left breast. On tamoxifen  with no side effects. Regular mammograms maintained. No new breast masses detected. - Continue tamoxifen  10 MG oral daily. - Continue annual mammograms. - Conduct annual follow-up by phone to monitor tamoxifen  use.  Postmenopausal vaginal dryness, on vaginal estrogen therapy Postmenopausal vaginal dryness improved with vaginal estrogen therapy. No increased breast cancer risk with use. - Continue estradiol  (estrace  vaginal) 0.1 MG/GM vaginal at bedtime.   No orders of the defined types were placed in this encounter.  The patient has a good understanding of the overall plan. she agrees with it. she will call with any problems that may develop before the next visit here.  I personally spent a total of  30 minutes in the care of the patient today including preparing to see the patient, getting/reviewing separately obtained history, performing a medically appropriate exam/evaluation, counseling and educating, placing orders, referring and communicating with other health care professionals, documenting clinical information in the EHR, independently interpreting results, communicating results, and coordinating care.   Viinay K Izzabell Klasen, MD 12/24/23

## 2023-12-24 NOTE — Assessment & Plan Note (Signed)
 12/23/2021:Screening detected left breast calcifications upper medial quadrant 0.8 cm, axilla negative, biopsy: Intermediate grade DCIS ER 95%, PR 50%    01/20/2022: Left lumpectomy: 1 cm intermediate grade DCIS, no invasive cancer, margins negative, ER 95%, PR 50% Met with radiation oncology and decided not to pursue radiation.   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     Vaginal Dryness Patient reports vaginal dryness and discomfort, likely due to Tamoxifen  and discontinuation of Premarin. Discussed recent studies showing no increased risk of breast cancer with topical estrogen cream. -Prescribe topical estrogen cream for vaginal dryness, to be used twice a week.

## 2023-12-29 DIAGNOSIS — Z23 Encounter for immunization: Secondary | ICD-10-CM | POA: Diagnosis not present

## 2024-01-12 DIAGNOSIS — L814 Other melanin hyperpigmentation: Secondary | ICD-10-CM | POA: Diagnosis not present

## 2024-01-12 DIAGNOSIS — L821 Other seborrheic keratosis: Secondary | ICD-10-CM | POA: Diagnosis not present

## 2024-01-12 DIAGNOSIS — Z85828 Personal history of other malignant neoplasm of skin: Secondary | ICD-10-CM | POA: Diagnosis not present

## 2024-01-12 DIAGNOSIS — L570 Actinic keratosis: Secondary | ICD-10-CM | POA: Diagnosis not present

## 2024-01-12 DIAGNOSIS — D225 Melanocytic nevi of trunk: Secondary | ICD-10-CM | POA: Diagnosis not present

## 2024-12-26 ENCOUNTER — Inpatient Hospital Stay: Admitting: Hematology and Oncology
